# Patient Record
Sex: Female | Born: 1980 | Race: White | Hispanic: No | Marital: Married | Smoking: Current every day smoker
Health system: Southern US, Community
[De-identification: ages and names within clinical notes are randomized; demographics above are authoritative.]

## PROBLEM LIST (undated history)

## (undated) DIAGNOSIS — J449 Chronic obstructive pulmonary disease, unspecified: Secondary | ICD-10-CM

## (undated) DIAGNOSIS — M1990 Unspecified osteoarthritis, unspecified site: Secondary | ICD-10-CM

## (undated) DIAGNOSIS — Z302 Encounter for sterilization: Secondary | ICD-10-CM

## (undated) DIAGNOSIS — Z5189 Encounter for other specified aftercare: Secondary | ICD-10-CM

## (undated) DIAGNOSIS — F419 Anxiety disorder, unspecified: Secondary | ICD-10-CM

## (undated) DIAGNOSIS — T7840XA Allergy, unspecified, initial encounter: Secondary | ICD-10-CM

## (undated) DIAGNOSIS — H60502 Unspecified acute noninfective otitis externa, left ear: Secondary | ICD-10-CM

## (undated) DIAGNOSIS — G43909 Migraine, unspecified, not intractable, without status migrainosus: Secondary | ICD-10-CM

## (undated) DIAGNOSIS — G709 Myoneural disorder, unspecified: Secondary | ICD-10-CM

## (undated) DIAGNOSIS — L299 Pruritus, unspecified: Secondary | ICD-10-CM

## (undated) DIAGNOSIS — H66001 Acute suppurative otitis media without spontaneous rupture of ear drum, right ear: Secondary | ICD-10-CM

## (undated) DIAGNOSIS — O09521 Supervision of elderly multigravida, first trimester: Secondary | ICD-10-CM

## (undated) DIAGNOSIS — IMO0002 Reserved for concepts with insufficient information to code with codable children: Secondary | ICD-10-CM

## (undated) DIAGNOSIS — J302 Other seasonal allergic rhinitis: Secondary | ICD-10-CM

## (undated) DIAGNOSIS — R569 Unspecified convulsions: Secondary | ICD-10-CM

## (undated) DIAGNOSIS — B86 Scabies: Secondary | ICD-10-CM

## (undated) DIAGNOSIS — G4730 Sleep apnea, unspecified: Secondary | ICD-10-CM

## (undated) DIAGNOSIS — K8020 Calculus of gallbladder without cholecystitis without obstruction: Secondary | ICD-10-CM

## (undated) DIAGNOSIS — L03818 Cellulitis of other sites: Secondary | ICD-10-CM

## (undated) DIAGNOSIS — K5090 Crohn's disease, unspecified, without complications: Secondary | ICD-10-CM

## (undated) HISTORY — DX: Supervision of elderly multigravida, first trimester: O09.521

## (undated) HISTORY — DX: Unspecified convulsions: R56.9

## (undated) HISTORY — DX: Encounter for sterilization: Z30.2

## (undated) HISTORY — DX: Chronic obstructive pulmonary disease, unspecified: J44.9

## (undated) HISTORY — DX: Pruritus, unspecified: L29.9

## (undated) HISTORY — DX: Encounter for other specified aftercare: Z51.89

## (undated) HISTORY — DX: Reserved for concepts with insufficient information to code with codable children: IMO0002

## (undated) HISTORY — DX: Scabies: B86

## (undated) HISTORY — DX: Anxiety disorder, unspecified: F41.9

## (undated) HISTORY — PX: GALLBLADDER SURGERY: SHX652

## (undated) HISTORY — DX: Myoneural disorder, unspecified: G70.9

## (undated) HISTORY — DX: Sleep apnea, unspecified: G47.30

## (undated) HISTORY — DX: Allergy, unspecified, initial encounter: T78.40XA

## (undated) HISTORY — PX: CHOLESTEATOMA EXCISION: SHX1345

## (undated) HISTORY — DX: Unspecified osteoarthritis, unspecified site: M19.90

## (undated) HISTORY — DX: Acute suppurative otitis media without spontaneous rupture of ear drum, right ear: H66.001

## (undated) HISTORY — DX: Unspecified acute noninfective otitis externa, left ear: H60.502

## (undated) HISTORY — DX: Cellulitis of other sites: L03.818

## (undated) HISTORY — PX: CHOLECYSTECTOMY: SHX55

---

## 2010-11-26 ENCOUNTER — Emergency Department (HOSPITAL_COMMUNITY)
Admission: EM | Admit: 2010-11-26 | Discharge: 2010-11-26 | Disposition: A | Discharge status: Home / Self Care | Payer: Medicaid Other | Attending: Emergency Medicine | Admitting: Emergency Medicine

## 2010-11-26 ENCOUNTER — Emergency Department (HOSPITAL_COMMUNITY): Payer: Medicaid Other

## 2010-11-26 ENCOUNTER — Encounter: Payer: Self-pay | Admitting: Emergency Medicine

## 2010-11-26 DIAGNOSIS — F17200 Nicotine dependence, unspecified, uncomplicated: Secondary | ICD-10-CM | POA: Insufficient documentation

## 2010-11-26 DIAGNOSIS — R109 Unspecified abdominal pain: Secondary | ICD-10-CM | POA: Insufficient documentation

## 2010-11-26 DIAGNOSIS — K5090 Crohn's disease, unspecified, without complications: Secondary | ICD-10-CM | POA: Insufficient documentation

## 2010-11-26 DIAGNOSIS — J45909 Unspecified asthma, uncomplicated: Secondary | ICD-10-CM | POA: Insufficient documentation

## 2010-11-26 DIAGNOSIS — R51 Headache: Secondary | ICD-10-CM | POA: Insufficient documentation

## 2010-11-26 DIAGNOSIS — R10819 Abdominal tenderness, unspecified site: Secondary | ICD-10-CM | POA: Insufficient documentation

## 2010-11-26 HISTORY — DX: Calculus of gallbladder without cholecystitis without obstruction: K80.20

## 2010-11-26 HISTORY — DX: Other seasonal allergic rhinitis: J30.2

## 2010-11-26 HISTORY — DX: Crohn's disease, unspecified, without complications: K50.90

## 2010-11-26 HISTORY — DX: Migraine, unspecified, not intractable, without status migrainosus: G43.909

## 2010-11-26 LAB — URINALYSIS, ROUTINE W REFLEX MICROSCOPIC
Bilirubin Urine: NEGATIVE
Glucose, UA: NEGATIVE mg/dL
Hgb urine dipstick: NEGATIVE
Ketones, ur: NEGATIVE mg/dL
Protein, ur: NEGATIVE mg/dL
Urobilinogen, UA: 0.2 mg/dL (ref 0.0–1.0)

## 2010-11-26 LAB — PREGNANCY, URINE: Preg Test, Ur: NEGATIVE

## 2010-11-26 MED ORDER — OXYCODONE-ACETAMINOPHEN 5-325 MG PO TABS
1.0000 | ORAL_TABLET | Freq: Once | ORAL | Status: AC
  Administered 2010-11-26: 1 via ORAL
  Filled 2010-11-26: qty 1

## 2010-11-26 MED ORDER — OXYCODONE-ACETAMINOPHEN 5-325 MG PO TABS: 1.0000 | ORAL_TABLET | ORAL | Status: AC | PRN

## 2010-11-26 MED ORDER — KETOROLAC TROMETHAMINE 60 MG/2ML IM SOLN
60.0000 mg | Freq: Once | INTRAMUSCULAR | Status: AC
  Administered 2010-11-26: 60 mg via INTRAMUSCULAR
  Filled 2010-11-26: qty 2

## 2010-11-26 NOTE — ED Notes (Signed)
MD at bedside to evaluate.

## 2010-11-26 NOTE — ED Notes (Signed)
Per patient dizziness, migraines, blood pressure "dropping", and left side flank pain x3 months.

## 2010-11-26 NOTE — ED Notes (Signed)
Patient also requesting to have left ear checked dur to drainage.

## 2010-11-26 NOTE — ED Provider Notes (Addendum)
History     CSN: [id number] Arrival date & time: 11/26/2010  6:19 PM   First MD Initiated Contact with Patient 11/26/10 1825      Chief Complaint  Patient presents with  . Dizziness  . Flank Pain  . Headache    (Consider location/radiation/quality/duration/timing/severity/associated sxs/prior treatment) Patient is a 30 y.o. female presenting with flank pain and headaches. The history is provided by the patient.  Flank Pain This is a recurrent problem. The current episode started more than 1 week ago (Patient states she has Crohn's but this pain has been coming and going for two months.). Episode frequency: Pain comes and goes more than ten times/day. The problem has been gradually worsening. Associated symptoms include headaches. Pertinent negatives include no chest pain, no abdominal pain and no shortness of breath. The symptoms are aggravated by nothing. The symptoms are relieved by medications (tylenol and advil without relief.). She has tried a warm compress, a cold compress, rest and acetaminophen for the symptoms. The treatment provided no relief.  Headache  Pertinent negatives include no shortness of breath.  They are chronic and this is not different from usual.  Past Medical History  Diagnosis Date  . Migraines   . Crohn's disease   . Seasonal allergies   . Asthma   . Gallstones     Past Surgical History  Procedure Date  . Cholesteatoma excision   . Cholecystectomy     Family History  Problem Relation Age of Onset  . Cancer Other   . Heart failure Other   . Diabetes Other     History  Substance Use Topics  . Smoking status: Current Everyday Smoker -- 1.0 packs/day for 20 years    Types: Cigarettes  . Smokeless tobacco: Never Used  . Alcohol Use: Yes     occasionally    OB History    Grav Para Term Preterm Abortions TAB SAB Ect Mult Living   1 1 1       1       Review of Systems  Respiratory: Negative for shortness of breath.   Cardiovascular:  Negative for chest pain.  Gastrointestinal: Negative for abdominal pain.  Genitourinary: Positive for flank pain. Negative for dysuria, frequency, hematuria, decreased urine volume, vaginal bleeding, vaginal discharge, enuresis, difficulty urinating, vaginal pain, menstrual problem, pelvic pain and dyspareunia.  Neurological: Positive for headaches.  All other systems reviewed and are negative.    Allergies  Darvocet and Penicillins  Home Medications  No current outpatient prescriptions on file.  BP 121/78  Pulse 89  Temp(Src) 98.2 F (36.8 C) (Oral)  Resp 16  Ht 5' 4.5" (1.638 m)  Wt 175 lb (79.379 kg)  BMI 29.57 kg/m2  SpO2 99%  LMP 11/04/2010  Physical Exam  Nursing note and vitals reviewed. Constitutional: She is oriented to person, place, and time. She appears well-developed and well-nourished.  HENT:  Head: Normocephalic and atraumatic.  Eyes: Conjunctivae are normal. Pupils are equal, round, and reactive to light.  Neck: Normal range of motion. Neck supple.  Cardiovascular: Normal rate and regular rhythm.   Pulmonary/Chest: Effort normal and breath sounds normal.  Abdominal: Soft. Bowel sounds are normal.       Diffuse mild upper abdominal tenderness  Musculoskeletal: Normal range of motion.  Neurological: She is alert and oriented to person, place, and time. She has normal reflexes.  Skin: Skin is warm.  Psychiatric: She has a normal mood and affect.    ED Course  Procedures (including critical  care time)  Labs Reviewed - No data to display No results found.   No diagnosis found.  Results for orders placed during the hospital encounter of 11/26/10  URINALYSIS, ROUTINE W REFLEX MICROSCOPIC      Component Value Range   Color, Urine YELLOW  YELLOW    Appearance CLEAR  CLEAR    Specific Gravity, Urine 1.020  1.005 - 1.030    pH 6.5  5.0 - 8.0    Glucose, UA NEGATIVE  NEGATIVE (mg/dL)   Hgb urine dipstick NEGATIVE  NEGATIVE    Bilirubin Urine NEGATIVE   NEGATIVE    Ketones, ur NEGATIVE  NEGATIVE (mg/dL)   Protein, ur NEGATIVE  NEGATIVE (mg/dL)   Urobilinogen, UA 0.2  0.0 - 1.0 (mg/dL)   Nitrite NEGATIVE  NEGATIVE    Leukocytes, UA NEGATIVE  NEGATIVE   PREGNANCY, URINE      Component Value Range   Preg Test, Ur NEGATIVE    POCT PREGNANCY, URINE      Component Value Range   Preg Test, Ur NEGATIVE      Ct Abdomen Pelvis Wo Contrast  11/26/2010  *RADIOLOGY REPORT*  Clinical Data: Left flank pain for 3 months.  Prior cholecystectomy.  CT ABDOMEN AND PELVIS WITHOUT CONTRAST  Technique:  Multidetector CT imaging of the abdomen and pelvis was performed following the standard protocol without intravenous contrast.  Comparison: None.  Findings: Tiny nonobstructing renal calculi.  Mild fatty infiltration of liver.  Unenhanced imaging without focal liver, splenic, adrenal, renal or pancreatic lesion.  Post cholecystectomy.  Under distended stomach and portions of colon and small bowel without evidence of extraluminal bowel inflammatory process, free air or free fluid.  Slight prominence of fat within wall of the cecum/ascending colon.  This may be a normal finding although also described in patients with chronic colitis.  Unenhanced imaging of the urinary bladder unremarkable.  Probable small left ovarian cyst incidentally noted.  No pelvic or abdominal adenopathy.  Top normal sized right inguinal node.  No bony destructive lesion.  Small bone island.  Fatty containing periumbilical hernia.  IMPRESSION: Tiny nonobstructing renal calculi.  No acute bowel inflammatory process.  Please see above discussion.  Original Report Authenticated By: Doug Sou, M.D.    MDM  Patient had some pain relief with IV Toradol but states she continues to have pain. CT of the abdomen reveals small Bilateral nonobstructing kidney since. She has no hematuria. She's not appear to have any ureteral obstruction. He is given 2 Percocet here. And she is advised to followup with  her primary care at home in [place] appear      Shaune Pollack, MD 11/26/10 2107  Shaune Pollack, MD 11/26/10 2107

## 2011-02-14 DIAGNOSIS — K219 Gastro-esophageal reflux disease without esophagitis: Secondary | ICD-10-CM | POA: Insufficient documentation

## 2011-02-14 DIAGNOSIS — Z7960 Long term (current) use of unspecified immunomodulators and immunosuppressants: Secondary | ICD-10-CM | POA: Insufficient documentation

## 2011-02-14 DIAGNOSIS — Z79899 Other long term (current) drug therapy: Secondary | ICD-10-CM | POA: Insufficient documentation

## 2011-02-22 ENCOUNTER — Encounter (HOSPITAL_COMMUNITY): Payer: Self-pay | Admitting: Emergency Medicine

## 2011-02-22 ENCOUNTER — Emergency Department (HOSPITAL_COMMUNITY): Payer: Medicaid Other

## 2011-02-22 ENCOUNTER — Emergency Department (HOSPITAL_COMMUNITY)
Admission: EM | Admit: 2011-02-22 | Discharge: 2011-02-22 | Disposition: A | Discharge status: Home / Self Care | Payer: Medicaid Other | Attending: Emergency Medicine | Admitting: Emergency Medicine

## 2011-02-22 DIAGNOSIS — Z79899 Other long term (current) drug therapy: Secondary | ICD-10-CM | POA: Insufficient documentation

## 2011-02-22 DIAGNOSIS — F17200 Nicotine dependence, unspecified, uncomplicated: Secondary | ICD-10-CM | POA: Insufficient documentation

## 2011-02-22 DIAGNOSIS — N390 Urinary tract infection, site not specified: Secondary | ICD-10-CM | POA: Insufficient documentation

## 2011-02-22 DIAGNOSIS — J45909 Unspecified asthma, uncomplicated: Secondary | ICD-10-CM | POA: Insufficient documentation

## 2011-02-22 DIAGNOSIS — R109 Unspecified abdominal pain: Secondary | ICD-10-CM | POA: Insufficient documentation

## 2011-02-22 DIAGNOSIS — K5090 Crohn's disease, unspecified, without complications: Secondary | ICD-10-CM | POA: Insufficient documentation

## 2011-02-22 DIAGNOSIS — R112 Nausea with vomiting, unspecified: Secondary | ICD-10-CM | POA: Insufficient documentation

## 2011-02-22 LAB — POCT I-STAT, CHEM 8
Calcium, Ion: 1.18 mmol/L (ref 1.12–1.32)
Glucose, Bld: 85 mg/dL (ref 70–99)
HCT: 48 % — ABNORMAL HIGH (ref 36.0–46.0)
Hemoglobin: 16.3 g/dL — ABNORMAL HIGH (ref 12.0–15.0)

## 2011-02-22 LAB — DIFFERENTIAL
Basophils Absolute: 0 10*3/uL (ref 0.0–0.1)
Basophils Relative: 0 % (ref 0–1)
Eosinophils Relative: 0 % (ref 0–5)
Monocytes Absolute: 0.9 10*3/uL (ref 0.1–1.0)
Neutro Abs: 8.2 10*3/uL — ABNORMAL HIGH (ref 1.7–7.7)

## 2011-02-22 LAB — CBC
HCT: 46.2 % — ABNORMAL HIGH (ref 36.0–46.0)
MCHC: 34.6 g/dL (ref 30.0–36.0)
MCV: 90.6 fL (ref 78.0–100.0)
Platelets: 224 10*3/uL (ref 150–400)
RDW: 12.9 % (ref 11.5–15.5)

## 2011-02-22 LAB — URINALYSIS, MICROSCOPIC ONLY
Specific Gravity, Urine: 1.028 (ref 1.005–1.030)
Urobilinogen, UA: 1 mg/dL (ref 0.0–1.0)
pH: 6 (ref 5.0–8.0)

## 2011-02-22 LAB — POCT PREGNANCY, URINE: Preg Test, Ur: NEGATIVE

## 2011-02-22 MED ORDER — OXYCODONE-ACETAMINOPHEN 5-325 MG PO TABS
1.0000 | ORAL_TABLET | Freq: Once | ORAL | Status: AC
  Administered 2011-02-22: 1 via ORAL

## 2011-02-22 MED ORDER — OXYCODONE-ACETAMINOPHEN 5-325 MG PO TABS
ORAL_TABLET | ORAL | Status: AC
  Filled 2011-02-22: qty 1

## 2011-02-22 MED ORDER — SULFAMETHOXAZOLE-TMP DS 800-160 MG PO TABS
1.0000 | ORAL_TABLET | Freq: Once | ORAL | Status: AC
  Administered 2011-02-22: 1 via ORAL
  Filled 2011-02-22: qty 1

## 2011-02-22 MED ORDER — SODIUM CHLORIDE 0.9 % IV SOLN
Freq: Once | INTRAVENOUS | Status: AC
  Administered 2011-02-22: 20 mL via INTRAVENOUS

## 2011-02-22 MED ORDER — HYDROMORPHONE HCL PF 1 MG/ML IJ SOLN
0.5000 mg | Freq: Once | INTRAMUSCULAR | Status: AC
  Administered 2011-02-22: 0.5 mg via INTRAVENOUS
  Filled 2011-02-22: qty 1

## 2011-02-22 MED ORDER — SULFAMETHOXAZOLE-TMP DS 800-160 MG PO TABS: 1.0000 | ORAL_TABLET | Freq: Two times a day (BID) | ORAL | Status: AC

## 2011-02-22 MED ORDER — ONDANSETRON HCL 4 MG/2ML IJ SOLN
4.0000 mg | Freq: Once | INTRAMUSCULAR | Status: AC
  Administered 2011-02-22: 4 mg via INTRAVENOUS
  Filled 2011-02-22: qty 2

## 2011-02-22 NOTE — ED Provider Notes (Signed)
History     CSN: [id number]  Arrival date & time 02/22/11  0144   First MD Initiated Contact with Patient 02/22/11 0221      Chief Complaint  Patient presents with  . Flank Pain    (Consider location/radiation/quality/duration/timing/severity/associated sxs/prior treatment) Patient is a 31 y.o. female presenting with flank pain. The history is provided by the patient.  Flank Pain This is a new problem. The current episode started in the past 7 days. The problem occurs constantly. The problem has been gradually worsening. Associated symptoms include nausea. Pertinent negatives include no abdominal pain or vomiting.    Past Medical History  Diagnosis Date  . Migraines   . Crohn's disease   . Seasonal allergies   . Asthma   . Gallstones     Past Surgical History  Procedure Date  . Cholesteatoma excision   . Cholecystectomy     Family History  Problem Relation Age of Onset  . Cancer Other   . Heart failure Other   . Diabetes Other     History  Substance Use Topics  . Smoking status: Current Everyday Smoker -- 1.0 packs/day for 20 years    Types: Cigarettes  . Smokeless tobacco: Never Used  . Alcohol Use: Yes     occasionally    OB History    Grav Para Term Preterm Abortions TAB SAB Ect Mult Living   1 1 1       1       Review of Systems  Gastrointestinal: Positive for nausea. Negative for vomiting, abdominal pain and diarrhea.  Genitourinary: Positive for flank pain. Negative for dysuria, frequency, vaginal bleeding and vaginal pain.    Allergies  Bee venom; Darvocet; and Penicillins  Home Medications   Current Outpatient Rx  Name Route Sig Dispense Refill  . ACETAMINOPHEN 500 MG PO TABS Oral Take 500 mg by mouth as needed. For pain     . ADALIMUMAB 40 MG/0.8ML Hardesty KIT Subcutaneous Inject 40 mg into the skin every 14 (fourteen) days.      . ALBUTEROL SULFATE HFA 108 (90 BASE) MCG/ACT IN AERS Inhalation Inhale 2 puffs into the lungs every 6 (six) hours  as needed. For shortness of breath     . DIAZEPAM 10 MG PO TABS Oral Take 10 mg by mouth daily.      . IBUPROFEN 200 MG PO TABS Oral Take 800 mg by mouth every 6 (six) hours as needed. For pain     . PANTOPRAZOLE SODIUM 20 MG PO TBEC Oral Take 20 mg by mouth 2 (two) times daily.    . SUMATRIPTAN SUCCINATE 100 MG PO TABS Oral Take 100 mg by mouth every 2 (two) hours as needed. For migraines     . SULFAMETHOXAZOLE-TMP DS 800-160 MG PO TABS Oral Take 1 tablet by mouth 2 (two) times daily. 6 tablet 0    BP 129/96  Pulse 112  Temp(Src) 98.1 F (36.7 C) (Oral)  Resp 18  SpO2 97%  LMP 01/31/2011  Physical Exam  Constitutional: She is oriented to person, place, and time. She appears well-developed and well-nourished.  HENT:  Head: Normocephalic.  Eyes: Pupils are equal, round, and reactive to light.  Neck: Normal range of motion.  Cardiovascular: Normal rate.   Pulmonary/Chest: Effort normal.  Abdominal: Soft. She exhibits no distension. There is no tenderness.  Neurological: She is alert and oriented to person, place, and time.  Skin: Skin is warm and dry.  Psychiatric: She has a normal  mood and affect.    ED Course  Procedures (including critical care time)  Labs Reviewed  URINALYSIS, WITH MICROSCOPIC - Abnormal; Notable for the following:    Color, Urine AMBER (*) BIOCHEMICALS MAY BE AFFECTED BY COLOR   APPearance CLOUDY (*)    Hgb urine dipstick LARGE (*)    Bilirubin Urine SMALL (*)    Ketones, ur 15 (*)    Protein, ur 30 (*)    Leukocytes, UA MODERATE (*)    Bacteria, UA FEW (*)    Squamous Epithelial / LPF MANY (*)    All other components within normal limits  CBC - Abnormal; Notable for the following:    WBC 13.6 (*)    Hemoglobin 16.0 (*)    HCT 46.2 (*)    All other components within normal limits  DIFFERENTIAL - Abnormal; Notable for the following:    Neutro Abs 8.2 (*)    Lymphs Abs 4.4 (*)    All other components within normal limits  POCT I-STAT, CHEM 8 -  Abnormal; Notable for the following:    Hemoglobin 16.3 (*)    HCT 48.0 (*)    All other components within normal limits  POCT PREGNANCY, URINE   Ct Abdomen Pelvis Wo Contrast  02/22/2011  *RADIOLOGY REPORT*  Clinical Data: Left-sided pain, nausea, vomiting.  CT ABDOMEN AND PELVIS WITHOUT CONTRAST  Technique:  Multidetector CT imaging of the abdomen and pelvis was performed following the standard protocol without intravenous contrast.  Comparison: 11/26/2010 CT  Findings: Limited images through the lung bases demonstrate no significant appreciable abnormality. The heart size is within normal limits. No pleural or pericardial effusion.  Intra-abdominal organ evaluation is limited without intravenous contrast.  Heterogeneous liver attenuation may reflect areas of fatty infiltration.  Status post cholecystectomy.  No biliary ductal dilatation.  Unremarkable spleen, pancreas, adrenal glands.  Symmetric renal size.  No hydronephrosis or hydroureter. Tiny nonobstructing right renal stone.  Otherwise, no urinary tract calculi identified.  No bowel obstruction.  No CT evidence for colitis.  Normal appendix.  No free intraperitoneal air or fluid.  Thin-walled bladder.  Unremarkable uterus and adnexa.  Trace free fluid within the pelvis.  No lymphadenopathy.  Normal caliber vasculature.  No acute osseous abnormality.  IMPRESSION: No hydronephrosis or urinary tract calculi.  The previously noted left renal stone is not identified today and may have passed in the interval.  Tiny nonobstructing right renal stone.  Original Report Authenticated By: Waneta Martins, M.D.     1. Urinary tract infection       MDM  Patient with left flank pain radiating to her left lower quadrant.  Nausea, has a history of kidney stones, has been able to pass them without intervention.  She's been taking over-the-counter ibuprofen or Tylenol without any relief.  Request IV start fluids at 20 mils per hour Zofran for nausea, and  Dilaudid for pain and will obtain noncontrast CT to locate the stone After review of her CT scan.  There is no evidence of a renal calculi on the left although there is some hematuria.  There is number of white cells in her urine, most likely a urinary tract infection.  Will treat as such with Bactrim and have her followup with her primary care physician        Arman Filter, NP 02/22/11 0525  Arman Filter, NP 02/22/11 5621  Arman Filter, NP 02/22/11 [phone]

## 2011-02-22 NOTE — ED Provider Notes (Signed)
Medical screening examination/treatment/procedure(s) were performed by non-physician practitioner and as supervising physician I was immediately available for consultation/collaboration.  Threasa Beards, MD 02/22/11 [phone]

## 2011-02-22 NOTE — ED Notes (Signed)
PT. REPORTS LEFT FLANK PAIN WITH HEMATURIA AND VOMITTING ONSET 4 DAYS AGO , STATES HISTORY OF KIDNEY STONES .

## 2011-02-22 NOTE — ED Notes (Signed)
Patient reports blood in urine and left flank pain x 4 with nausea days states she has a history of kidney stones and feels the same

## 2011-09-08 DIAGNOSIS — O0990 Supervision of high risk pregnancy, unspecified, unspecified trimester: Secondary | ICD-10-CM | POA: Insufficient documentation

## 2012-07-19 DIAGNOSIS — IMO0002 Reserved for concepts with insufficient information to code with codable children: Secondary | ICD-10-CM

## 2012-07-19 HISTORY — DX: Reserved for concepts with insufficient information to code with codable children: IMO0002

## 2012-10-01 DIAGNOSIS — M47816 Spondylosis without myelopathy or radiculopathy, lumbar region: Secondary | ICD-10-CM | POA: Insufficient documentation

## 2013-04-12 DIAGNOSIS — R209 Unspecified disturbances of skin sensation: Secondary | ICD-10-CM | POA: Insufficient documentation

## 2013-04-12 DIAGNOSIS — Z79899 Other long term (current) drug therapy: Secondary | ICD-10-CM | POA: Insufficient documentation

## 2013-11-21 ENCOUNTER — Encounter (HOSPITAL_COMMUNITY): Payer: Self-pay | Admitting: Emergency Medicine

## 2015-06-04 DIAGNOSIS — R29898 Other symptoms and signs involving the musculoskeletal system: Secondary | ICD-10-CM | POA: Insufficient documentation

## 2015-06-08 DIAGNOSIS — K50113 Crohn's disease of large intestine with fistula: Secondary | ICD-10-CM | POA: Insufficient documentation

## 2015-06-26 DIAGNOSIS — M79605 Pain in left leg: Secondary | ICD-10-CM

## 2015-06-26 DIAGNOSIS — M79604 Pain in right leg: Secondary | ICD-10-CM | POA: Insufficient documentation

## 2015-06-26 DIAGNOSIS — G8929 Other chronic pain: Secondary | ICD-10-CM | POA: Insufficient documentation

## 2015-11-27 LAB — HM PAP SMEAR: HM Pap smear: NEGATIVE

## 2015-11-29 DIAGNOSIS — O09899 Supervision of other high risk pregnancies, unspecified trimester: Secondary | ICD-10-CM | POA: Insufficient documentation

## 2015-11-29 DIAGNOSIS — Z2839 Other underimmunization status: Secondary | ICD-10-CM | POA: Insufficient documentation

## 2016-01-23 DIAGNOSIS — K5090 Crohn's disease, unspecified, without complications: Secondary | ICD-10-CM | POA: Insufficient documentation

## 2016-02-19 DIAGNOSIS — Z302 Encounter for sterilization: Secondary | ICD-10-CM

## 2016-02-19 HISTORY — DX: Encounter for sterilization: Z30.2

## 2016-09-23 DIAGNOSIS — O09521 Supervision of elderly multigravida, first trimester: Secondary | ICD-10-CM

## 2016-09-23 HISTORY — DX: Supervision of elderly multigravida, first trimester: O09.521

## 2017-01-28 DIAGNOSIS — O0933 Supervision of pregnancy with insufficient antenatal care, third trimester: Secondary | ICD-10-CM | POA: Insufficient documentation

## 2017-01-28 DIAGNOSIS — O9A419 Sexual abuse complicating pregnancy, unspecified trimester: Secondary | ICD-10-CM | POA: Insufficient documentation

## 2017-06-21 DIAGNOSIS — F909 Attention-deficit hyperactivity disorder, unspecified type: Secondary | ICD-10-CM | POA: Insufficient documentation

## 2017-06-21 DIAGNOSIS — F319 Bipolar disorder, unspecified: Secondary | ICD-10-CM | POA: Insufficient documentation

## 2017-06-21 DIAGNOSIS — R630 Anorexia: Secondary | ICD-10-CM | POA: Insufficient documentation

## 2017-06-21 DIAGNOSIS — H900 Conductive hearing loss, bilateral: Secondary | ICD-10-CM | POA: Insufficient documentation

## 2017-06-21 DIAGNOSIS — J45909 Unspecified asthma, uncomplicated: Secondary | ICD-10-CM | POA: Insufficient documentation

## 2017-06-21 DIAGNOSIS — T7411XA Adult physical abuse, confirmed, initial encounter: Secondary | ICD-10-CM | POA: Insufficient documentation

## 2017-07-21 ENCOUNTER — Emergency Department
Admission: EM | Admit: 2017-07-21 | Discharge: 2017-07-21 | Disposition: A | Discharge status: Home / Self Care | Payer: Medicaid Other | Attending: Emergency Medicine | Admitting: Emergency Medicine

## 2017-07-21 ENCOUNTER — Encounter: Payer: Self-pay | Admitting: Emergency Medicine

## 2017-07-21 DIAGNOSIS — Z79899 Other long term (current) drug therapy: Secondary | ICD-10-CM | POA: Diagnosis not present

## 2017-07-21 DIAGNOSIS — R1032 Left lower quadrant pain: Secondary | ICD-10-CM | POA: Diagnosis present

## 2017-07-21 DIAGNOSIS — K5090 Crohn's disease, unspecified, without complications: Secondary | ICD-10-CM | POA: Diagnosis not present

## 2017-07-21 DIAGNOSIS — F17210 Nicotine dependence, cigarettes, uncomplicated: Secondary | ICD-10-CM | POA: Diagnosis not present

## 2017-07-21 DIAGNOSIS — R109 Unspecified abdominal pain: Secondary | ICD-10-CM

## 2017-07-21 DIAGNOSIS — J45909 Unspecified asthma, uncomplicated: Secondary | ICD-10-CM | POA: Diagnosis not present

## 2017-07-21 LAB — CBC
HCT: 37.4 % (ref 35.0–47.0)
Hemoglobin: 12.1 g/dL (ref 12.0–16.0)
MCH: 25.7 pg — ABNORMAL LOW (ref 26.0–34.0)
MCHC: 32.3 g/dL (ref 32.0–36.0)
MCV: 79.7 fL — ABNORMAL LOW (ref 80.0–100.0)
Platelets: 303 10*3/uL (ref 150–440)
RBC: 4.69 MIL/uL (ref 3.80–5.20)
RDW: 17.7 % — AB (ref 11.5–14.5)
WBC: 12.1 10*3/uL — AB (ref 3.6–11.0)

## 2017-07-21 LAB — URINALYSIS, ROUTINE W REFLEX MICROSCOPIC
BACTERIA UA: NONE SEEN
BILIRUBIN URINE: NEGATIVE
Glucose, UA: NEGATIVE mg/dL
HGB URINE DIPSTICK: NEGATIVE
Ketones, ur: NEGATIVE mg/dL
NITRITE: NEGATIVE
Protein, ur: NEGATIVE mg/dL
SPECIFIC GRAVITY, URINE: 1.021 (ref 1.005–1.030)
pH: 6 (ref 5.0–8.0)

## 2017-07-21 LAB — COMPREHENSIVE METABOLIC PANEL
ALBUMIN: 3.2 g/dL — AB (ref 3.5–5.0)
ALT: 9 U/L (ref 0–44)
ANION GAP: 7 (ref 5–15)
AST: 17 U/L (ref 15–41)
Alkaline Phosphatase: 77 U/L (ref 38–126)
BUN: 9 mg/dL (ref 6–20)
CHLORIDE: 109 mmol/L (ref 98–111)
CO2: 24 mmol/L (ref 22–32)
Calcium: 8.5 mg/dL — ABNORMAL LOW (ref 8.9–10.3)
Creatinine, Ser: 0.51 mg/dL (ref 0.44–1.00)
GFR calc Af Amer: >60 mL/min (ref >60)
GFR calc non Af Amer: >60 mL/min (ref >60)
GLUCOSE: 96 mg/dL (ref 70–99)
Potassium: 3.8 mmol/L (ref 3.5–5.1)
SODIUM: 140 mmol/L (ref 135–145)
TOTAL PROTEIN: 7.5 g/dL (ref 6.5–8.1)
Total Bilirubin: 0.5 mg/dL (ref 0.3–1.2)

## 2017-07-21 LAB — LIPASE, BLOOD: Lipase: 40 U/L (ref 11–51)

## 2017-07-21 MED ORDER — HYDROCODONE-ACETAMINOPHEN 5-325 MG PO TABS: 1.0000 | ORAL_TABLET | ORAL | 0 refills | Status: DC | PRN

## 2017-07-21 MED ORDER — HYDROCODONE-ACETAMINOPHEN 5-325 MG PO TABS
1.0000 | ORAL_TABLET | Freq: Once | ORAL | Status: AC
  Administered 2017-07-21: 1 via ORAL
  Filled 2017-07-21: qty 1

## 2017-07-21 NOTE — ED Triage Notes (Signed)
PT arrived with complaints of left flank pain. Pt denies any pain/burning with urination.

## 2017-07-21 NOTE — ED Provider Notes (Signed)
[facility] Emergency Department Provider Note  Time seen: 11:19 AM  I have reviewed the triage vital signs and the nursing notes.   HISTORY  Chief Complaint Flank Pain    HPI Sherry Gomez is a 37 y.o. female with a past medical history of Crohn's disease presents to the emergency department for left flank pain.  According to the patient for the past 1 week she has been experiencing intermittent left flank pain mostly in the left back.  Denies any hematuria, dysuria, fever, nausea, vomiting.  Has a history of Crohn's states she has frequent diarrhea but denies any black or bloody stool.  Denies any prior kidney stones.  Denies any musculoskeletal symptoms.   Past Medical History:  Diagnosis Date  . Asthma   . Crohn's disease (HCC)   . Gallstones   . Migraines   . Seasonal allergies     There are no active problems to display for this patient.   Past Surgical History:  Procedure Laterality Date  . CHOLECYSTECTOMY    . CHOLESTEATOMA EXCISION      Prior to Admission medications   Medication Sig Start Date End Date Taking? Authorizing Provider  acetaminophen (TYLENOL) 500 MG tablet Take 500 mg by mouth as needed. For pain     [provider]  adalimumab (HUMIRA) 40 MG/0.8ML injection Inject 40 mg into the skin every 14 (fourteen) days.      [provider]  albuterol (PROVENTIL HFA;VENTOLIN HFA) 108 (90 BASE) MCG/ACT inhaler Inhale 2 puffs into the lungs every 6 (six) hours as needed. For shortness of breath     [provider]  diazepam (VALIUM) 10 MG tablet Take 10 mg by mouth daily.      [provider]  ibuprofen (ADVIL,MOTRIN) 200 MG tablet Take 800 mg by mouth every 6 (six) hours as needed. For pain     [provider]  pantoprazole (PROTONIX) 20 MG tablet Take 20 mg by mouth 2 (two) times daily.    [provider]  SUMAtriptan (IMITREX) 100 MG tablet Take 100 mg by mouth every 2 (two) hours as  needed. For migraines     [provider]    Allergies  Allergen Reactions  . Bee Venom Swelling  . Darvocet [Propoxyphene N-Acetaminophen] Hives  . Penicillins Other (See Comments)    Family History  Problem Relation Age of Onset  . Cancer Other   . Heart failure Other   . Diabetes Other     Social History Social History   Tobacco Use  . Smoking status: Current Every Day Smoker    Packs/day: 1.00    Years: 20.00    Pack years: 20.00    Types: Cigarettes  . Smokeless tobacco: Never Used  Substance Use Topics  . Alcohol use: Yes    Comment: occasionally  . Drug use: No    Review of Systems Constitutional: Negative for fever. Eyes: Negative for visual complaints ENT: Negative for recent illness/congestion Cardiovascular: Negative for chest pain. Respiratory: Negative for shortness of breath. Gastrointestinal: Left flank pain but no anterior abdominal pain.  Negative for nausea vomiting or diarrhea. Genitourinary: Negative for urinary compaints.  Negative for bleeding or discharge. Musculoskeletal: Negative for musculoskeletal complaints Skin: Negative for skin complaints  Neurological: Negative for headache All other ROS negative  ____________________________________________   PHYSICAL EXAM:  VITAL SIGNS: ED Triage Vitals  Enc Vitals Group     BP 07/21/17 1054 (!) 144/93     Pulse Rate  07/21/17 1054 96     Resp 07/21/17 1054 20     Temp 07/21/17 1054 98.2 F (36.8 C)     Temp Source 07/21/17 1054 Oral     SpO2 07/21/17 1054 98 %     Weight 07/21/17 1055 126 lb (57.2 kg)     Height 07/21/17 1055 5' 4.5" (1.638 m)     Head Circumference --      Peak Flow --      Pain Score 07/21/17 1056 6     Pain Loc --      Pain Edu? --      Excl. in GC? --    Constitutional: Alert and oriented. Well appearing and in no distress. Eyes: Normal exam ENT   Head: Normocephalic and atraumatic.   Mouth/Throat: Mucous membranes are  moist. Cardiovascular: Normal rate, regular rhythm. No murmur Respiratory: Normal respiratory effort without tachypnea nor retractions. Breath sounds are clear  Gastrointestinal: Soft and nontender. No distention.  Mild left CVA tenderness to palpation. Musculoskeletal: Nontender with normal range of motion in all extremities.  Neurologic:  Normal speech and language. No gross focal neurologic deficits Skin:  Skin is warm, dry and intact.  Psychiatric: Mood and affect are normal.  ____________________________________________   INITIAL IMPRESSION / ASSESSMENT AND PLAN / ED COURSE  Pertinent labs & imaging results that were available during my care of the patient were reviewed by me and considered in my medical decision making (see chart for details).  Patient presents to the emergency department with left flank pain, ongoing but intermittent for the past 1 week.  Differential would include kidney stone, urinary tract infection, pyelonephritis or colitis.  Patient's lab work is largely within normal limits, slight leukocytosis.  Patient overall appears well, states she is feeling somewhat better after pain medication.  I discussed the options with the patient as far as discharged home with a short course of pain medication follow-up with her doctor and returning for any worsening pain or fever, versus obtaining CT imaging in the emergency department.  Patient would prefer to go home with a trial of pain medication and will return if pain worsens or she develops a fever.  ____________________________________________   FINAL CLINICAL IMPRESSION(S) / ED DIAGNOSES  Flank pain    Minna Antis, MD 07/21/17 1344

## 2018-01-01 ENCOUNTER — Ambulatory Visit: Payer: Self-pay | Admitting: Family Medicine

## 2018-01-19 ENCOUNTER — Other Ambulatory Visit: Payer: Self-pay

## 2018-01-19 ENCOUNTER — Encounter: Payer: Self-pay | Admitting: Emergency Medicine

## 2018-01-19 ENCOUNTER — Emergency Department
Admission: EM | Admit: 2018-01-19 | Discharge: 2018-01-19 | Disposition: A | Discharge status: Home / Self Care | Payer: Medicaid Other | Attending: Emergency Medicine | Admitting: Emergency Medicine

## 2018-01-19 DIAGNOSIS — K5090 Crohn's disease, unspecified, without complications: Secondary | ICD-10-CM | POA: Insufficient documentation

## 2018-01-19 DIAGNOSIS — J45909 Unspecified asthma, uncomplicated: Secondary | ICD-10-CM | POA: Diagnosis not present

## 2018-01-19 DIAGNOSIS — F17210 Nicotine dependence, cigarettes, uncomplicated: Secondary | ICD-10-CM | POA: Insufficient documentation

## 2018-01-19 DIAGNOSIS — F419 Anxiety disorder, unspecified: Secondary | ICD-10-CM | POA: Insufficient documentation

## 2018-01-19 DIAGNOSIS — Z79899 Other long term (current) drug therapy: Secondary | ICD-10-CM | POA: Insufficient documentation

## 2018-01-19 DIAGNOSIS — Z760 Encounter for issue of repeat prescription: Secondary | ICD-10-CM | POA: Diagnosis present

## 2018-01-19 DIAGNOSIS — F4325 Adjustment disorder with mixed disturbance of emotions and conduct: Secondary | ICD-10-CM

## 2018-01-19 LAB — COMPREHENSIVE METABOLIC PANEL
ALBUMIN: 3.7 g/dL (ref 3.5–5.0)
ALT: 10 U/L (ref 0–44)
ANION GAP: 7 (ref 5–15)
AST: 16 U/L (ref 15–41)
Alkaline Phosphatase: 77 U/L (ref 38–126)
BILIRUBIN TOTAL: 0.5 mg/dL (ref 0.3–1.2)
BUN: 13 mg/dL (ref 6–20)
CO2: 20 mmol/L — ABNORMAL LOW (ref 22–32)
Calcium: 8.5 mg/dL — ABNORMAL LOW (ref 8.9–10.3)
Chloride: 109 mmol/L (ref 98–111)
Creatinine, Ser: 0.52 mg/dL (ref 0.44–1.00)
GFR calc Af Amer: >60 mL/min (ref >60)
Glucose, Bld: 99 mg/dL (ref 70–99)
POTASSIUM: 4.1 mmol/L (ref 3.5–5.1)
Sodium: 136 mmol/L (ref 135–145)
TOTAL PROTEIN: 7.8 g/dL (ref 6.5–8.1)

## 2018-01-19 LAB — ACETAMINOPHEN LEVEL

## 2018-01-19 LAB — CBC
HEMATOCRIT: 40.9 % (ref 36.0–46.0)
HEMOGLOBIN: 12.6 g/dL (ref 12.0–15.0)
MCH: 26.8 pg (ref 26.0–34.0)
MCHC: 30.8 g/dL (ref 30.0–36.0)
MCV: 87 fL (ref 80.0–100.0)
NRBC: 0 % (ref 0.0–0.2)
Platelets: 245 10*3/uL (ref 150–400)
RBC: 4.7 MIL/uL (ref 3.87–5.11)
RDW: 16.3 % — ABNORMAL HIGH (ref 11.5–15.5)
WBC: 10.5 10*3/uL (ref 4.0–10.5)

## 2018-01-19 LAB — ETHANOL

## 2018-01-19 LAB — SALICYLATE LEVEL: Salicylate Lvl: <7 mg/dL (ref 2.8–30.0)

## 2018-01-19 NOTE — ED Notes (Addendum)
Patient assigned to appropriate care area   Introduced self to pt  Patient oriented to unit/care area: Informed that, for their safety, care areas are designed for safety and visiting and phone hours explained to patient. Patient verbalizes understanding, and verbal contract for safety obtained  Environment secured   Pt presents to ED, pt states she needs her medications refilled. Pt states she needs adderall and valium refilled, states has not been on her meds in months. Pt states she has been "putting a block up" to keep her from "hitting the elderly". Patient says the elderly lady is her husbands ex-wife ( "Cherylann Ratel Vicks"), . Pt states thoughts of wanting to hurt "Cherylann Ratel Vicks", pt states "I had to stay away from all of the people out there because I just kept seeing her face".

## 2018-01-19 NOTE — Discharge Instructions (Addendum)
Follow-up at [facility] to get set up with a new doctor and started back on medications.  Return to the ER for any new or worsening symptoms that concern you such as worsening anxiety, any thoughts of wanting to hurt yourself or anyone else, or any acute medical complaints.

## 2018-01-19 NOTE — ED Triage Notes (Signed)
Pt presents to ED via POV, pt states she needs her medications refilled. Pt states she needs adderall and valium refilled, states has not been on her meds in months. Pt states she has been "putting a block up" to keep her from "hitting the elderly". Pt states thoughts of wanting to hurt "Cherylann Ratel Vicks", pt states "I had to stay away from all of the people out there because I just kept seeing her face".

## 2018-01-19 NOTE — ED Notes (Signed)
Patient talking to the ER doctor

## 2018-01-19 NOTE — ED Notes (Signed)
First Nurse Note: Patient ambulatory to Stat desk stating she wants to leave and go to [facility] clinic.  Patient reassured that she is in the right place and she agrees to stay.
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
## 2018-01-19 NOTE — ED Notes (Signed)
Personal Belongings  Nature conservation officer Black shoes Wallace [employer]ull[employer] and black snake colored pants  1 pair White socks Gray and pink jacket Tan hair tie [employer] with crack screen [employer]

## 2018-01-19 NOTE — ED Notes (Signed)

## 2018-01-19 NOTE — BH Assessment (Signed)
Writer was unable to complete TTS Consult. Patient was seen by Psych MD and discharged before writer was able to talk with the patient.

## 2018-02-02 ENCOUNTER — Encounter: Payer: Self-pay | Admitting: *Deleted

## 2018-02-02 ENCOUNTER — Encounter: Payer: Self-pay | Admitting: Family Medicine

## 2018-02-02 ENCOUNTER — Ambulatory Visit: Payer: Medicaid Other | Admitting: Family Medicine

## 2018-02-02 VITALS — BP 112/74 | HR 90 | Temp 97.8°F | Ht 64.5 in | Wt 120.0 lb

## 2018-02-02 DIAGNOSIS — F17200 Nicotine dependence, unspecified, uncomplicated: Secondary | ICD-10-CM | POA: Insufficient documentation

## 2018-02-02 DIAGNOSIS — F909 Attention-deficit hyperactivity disorder, unspecified type: Secondary | ICD-10-CM

## 2018-02-02 DIAGNOSIS — F3132 Bipolar disorder, current episode depressed, moderate: Secondary | ICD-10-CM

## 2018-02-02 DIAGNOSIS — L03818 Cellulitis of other sites: Secondary | ICD-10-CM | POA: Insufficient documentation

## 2018-02-02 DIAGNOSIS — L299 Pruritus, unspecified: Secondary | ICD-10-CM

## 2018-02-02 DIAGNOSIS — Z13220 Encounter for screening for lipoid disorders: Secondary | ICD-10-CM

## 2018-02-02 DIAGNOSIS — M549 Dorsalgia, unspecified: Secondary | ICD-10-CM | POA: Insufficient documentation

## 2018-02-02 DIAGNOSIS — I959 Hypotension, unspecified: Secondary | ICD-10-CM | POA: Insufficient documentation

## 2018-02-02 DIAGNOSIS — Z23 Encounter for immunization: Secondary | ICD-10-CM | POA: Diagnosis not present

## 2018-02-02 DIAGNOSIS — G5603 Carpal tunnel syndrome, bilateral upper limbs: Secondary | ICD-10-CM

## 2018-02-02 DIAGNOSIS — F410 Panic disorder [episodic paroxysmal anxiety] without agoraphobia: Secondary | ICD-10-CM | POA: Insufficient documentation

## 2018-02-02 DIAGNOSIS — G894 Chronic pain syndrome: Secondary | ICD-10-CM | POA: Diagnosis not present

## 2018-02-02 DIAGNOSIS — H900 Conductive hearing loss, bilateral: Secondary | ICD-10-CM

## 2018-02-02 DIAGNOSIS — J329 Chronic sinusitis, unspecified: Secondary | ICD-10-CM | POA: Insufficient documentation

## 2018-02-02 DIAGNOSIS — J309 Allergic rhinitis, unspecified: Secondary | ICD-10-CM | POA: Insufficient documentation

## 2018-02-02 DIAGNOSIS — H60502 Unspecified acute noninfective otitis externa, left ear: Secondary | ICD-10-CM | POA: Insufficient documentation

## 2018-02-02 DIAGNOSIS — G8929 Other chronic pain: Secondary | ICD-10-CM | POA: Insufficient documentation

## 2018-02-02 DIAGNOSIS — R202 Paresthesia of skin: Secondary | ICD-10-CM

## 2018-02-02 DIAGNOSIS — H66001 Acute suppurative otitis media without spontaneous rupture of ear drum, right ear: Secondary | ICD-10-CM

## 2018-02-02 DIAGNOSIS — K5010 Crohn's disease of large intestine without complications: Secondary | ICD-10-CM | POA: Diagnosis not present

## 2018-02-02 DIAGNOSIS — M797 Fibromyalgia: Secondary | ICD-10-CM

## 2018-02-02 DIAGNOSIS — B86 Scabies: Secondary | ICD-10-CM

## 2018-02-02 DIAGNOSIS — F4312 Post-traumatic stress disorder, chronic: Secondary | ICD-10-CM

## 2018-02-02 DIAGNOSIS — R200 Anesthesia of skin: Secondary | ICD-10-CM

## 2018-02-02 DIAGNOSIS — R8281 Pyuria: Secondary | ICD-10-CM

## 2018-02-02 HISTORY — DX: Scabies: B86

## 2018-02-02 HISTORY — DX: Acute suppurative otitis media without spontaneous rupture of ear drum, right ear: H66.001

## 2018-02-02 HISTORY — DX: Unspecified acute noninfective otitis externa, left ear: H60.502

## 2018-02-02 HISTORY — DX: Pruritus, unspecified: L29.9

## 2018-02-02 HISTORY — DX: Cellulitis of other sites: L03.818

## 2018-02-02 NOTE — Patient Instructions (Signed)
Tdap Vaccine (Tetanus, Diphtheria and Pertussis): What You Need to Know 1. Why get vaccinated? Tetanus, diphtheria and pertussis are very serious diseases. Tdap vaccine can protect [place] from these diseases. And, Tdap vaccine given to pregnant women can protect newborn babies against pertussis.Marland Kitchen TETANUS (Lockjaw) is rare in the [place] today. It causes painful muscle tightening and stiffness, usually all over the body.  It can lead to tightening of muscles in the head and neck so you can't open your mouth, swallow, or sometimes even breathe. Tetanus kills about 1 out of 10 people who are infected even after receiving the best medical care. DIPHTHERIA is also rare in the [place] today. It can cause a thick coating to form in the back of the throat.  It can lead to breathing problems, heart failure, paralysis, and death. PERTUSSIS (Whooping Cough) causes severe coughing spells, which can cause difficulty breathing, vomiting and disturbed sleep.  It can also lead to weight loss, incontinence, and rib fractures. Up to 2 in 100 adolescents and 5 in 100 adults with pertussis are hospitalized or have complications, which could include pneumonia or death. These diseases are caused by bacteria. Diphtheria and pertussis are spread from person to person through secretions from coughing or sneezing. Tetanus enters the body through cuts, scratches, or wounds. Before vaccines, as many as 200,000 cases of diphtheria, 200,000 cases of pertussis, and hundreds of cases of tetanus, were reported in the [place] each year. Since vaccination began, reports of cases for tetanus and diphtheria have dropped by about 99% and for pertussis by about 80%. 2. Tdap vaccine Tdap vaccine can protect adolescents and adults from tetanus, diphtheria, and pertussis. One dose of Tdap is routinely given at age 61 or 77. People who did not get Tdap at that age should get it as soon as possible. Tdap is especially important  for healthcare professionals and anyone having close contact with a baby younger than 12 months. Pregnant women should get a dose of Tdap during every pregnancy, to protect the newborn from pertussis. Infants are most at risk for severe, life-threatening complications from pertussis. Another vaccine, called Td, protects against tetanus and diphtheria, but not pertussis. A Td booster should be given every 10 years. Tdap may be given as one of these boosters if you have never gotten Tdap before. Tdap may also be given after a severe cut or burn to prevent tetanus infection. Your doctor or the person giving you the vaccine can give you more information. Tdap may safely be given at the same time as other vaccines. 3. Some people should not get this vaccine  A person who has ever had a life-threatening allergic reaction after a previous dose of any diphtheria, tetanus or pertussis containing vaccine, OR has a severe allergy to any part of this vaccine, should not get Tdap vaccine. Tell the person giving the vaccine about any severe allergies.  Anyone who had coma or long repeated seizures within 7 days after a childhood dose of DTP or DTaP, or a previous dose of Tdap, should not get Tdap, unless a cause other than the vaccine was found. They can still get Td.  Talk to your doctor if you: ? have seizures or another nervous system problem, ? had severe pain or swelling after any vaccine containing diphtheria, tetanus or pertussis, ? ever had a condition called Guillain-Barr Syndrome (GBS), ? aren't feeling well on the day the shot is scheduled. 4. Risks With any medicine, including vaccines, there is  a chance of side effects. These are usually mild and go away on their own. Serious reactions are also possible but are rare. Most people who get Tdap vaccine do not have any problems with it. Mild problems following Tdap (Did not interfere with activities)  Pain where the shot was given (about 3 in 4  adolescents or 2 in 3 adults)  Redness or swelling where the shot was given (about 1 person in 5)  Mild fever of at least 100.40F (up to about 1 in 25 adolescents or 1 in 100 adults)  Headache (about 3 or 4 people in 10)  Tiredness (about 1 person in 3 or 4)  Nausea, vomiting, diarrhea, stomach ache (up to 1 in 4 adolescents or 1 in 10 adults)  Chills, sore joints (about 1 person in 10)  Body aches (about 1 person in 3 or 4)  Rash, swollen glands (uncommon) Moderate problems following Tdap (Interfered with activities, but did not require medical attention)  Pain where the shot was given (up to 1 in 5 or 6)  Redness or swelling where the shot was given (up to about 1 in 16 adolescents or 1 in 12 adults)  Fever over 102F (about 1 in 100 adolescents or 1 in 250 adults)  Headache (about 1 in 7 adolescents or 1 in 10 adults)  Nausea, vomiting, diarrhea, stomach ache (up to 1 or 3 people in 100)  Swelling of the entire arm where the shot was given (up to about 1 in 500). Severe problems following Tdap (Unable to perform usual activities; required medical attention)  Swelling, severe pain, bleeding and redness in the arm where the shot was given (rare). Problems that could happen after any vaccine:  People sometimes faint after a medical procedure, including vaccination. Sitting or lying down for about 15 minutes can help prevent fainting, and injuries caused by a fall. Tell your doctor if you feel dizzy, or have vision changes or ringing in the ears.  Some people get severe pain in the shoulder and have difficulty moving the arm where a shot was given. This happens very rarely.  Any medication can cause a severe allergic reaction. Such reactions from a vaccine are very rare, estimated at fewer than 1 in a million doses, and would happen within a few minutes to a few hours after the vaccination. As with any medicine, there is a very remote chance of a vaccine causing a serious  injury or death. The safety of vaccines is always being monitored. For more information, visit: http://www.aguilar.org/ 5. What if there is a serious problem? What should I look for?  Look for anything that concerns you, such as signs of a severe allergic reaction, very high fever, or unusual behavior. Signs of a severe allergic reaction can include hives, swelling of the face and throat, difficulty breathing, a fast heartbeat, dizziness, and weakness. These would usually start a few minutes to a few hours after the vaccination. What should I do?  If you think it is a severe allergic reaction or other emergency that can't wait, call 9-1-1 or get the person to the nearest hospital. Otherwise, call your doctor.  Afterward, the reaction should be reported to the Vaccine Adverse Event Reporting System (VAERS). Your doctor might file this report, or you can do it yourself through the VAERS web site at www.vaers.SamedayNews.es, or by calling 302-685-4097. VAERS does not give medical advice. 6. The National Vaccine Injury Compensation Program The [employer] Vaccine Injury Compensation Program ([place]) is  a federal program that was created to compensate people who may have been injured by certain vaccines. Persons who believe they may have been injured by a vaccine can learn about the program and about filing a claim by calling 954 296 2741 or visiting the [place] website at GoldCloset.com.ee. There is a time limit to file a claim for compensation. 7. How can I learn more?  Ask your doctor. He or she can give you the vaccine package insert or suggest other sources of information.  Call your local or state health department.  Contact the Centers for Disease Control and Prevention (CDC): ? Call 570-213-0381 (1-800-CDC-INFO) or ? Visit CDC's website at http://hunter.com/ Vaccine Information Statement Tdap Vaccine (03/15/2013) This information is not intended to replace advice given to you  by your health care provider. Make sure you discuss any questions you have with your health care provider. Document Released: 07/08/2011 Document Revised: 08/24/2017 Document Reviewed: 08/24/2017 Elsevier Interactive Patient Education  2019 [place]. Influenza (Flu) Vaccine (Inactivated or Recombinant): What You Need to Know 1. Why get vaccinated? Influenza vaccine can prevent influenza (flu). Flu is a contagious disease that spreads around the [place] every year, usually between October and May. Anyone can get the flu, but it is more dangerous for some people. Infants and young children, people 34 years of age and older, pregnant women, and people with certain health conditions or a weakened immune system are at greatest risk of flu complications. Pneumonia, bronchitis, sinus infections and ear infections are examples of flu-related complications. If you have a medical condition, such as heart disease, cancer or diabetes, flu can make it worse. Flu can cause fever and chills, sore throat, muscle aches, fatigue, cough, headache, and runny or stuffy nose. Some people may have vomiting and diarrhea, though this is more common in children than adults. Each year thousands of people in the [place] die from flu, and many more are hospitalized. Flu vaccine prevents millions of illnesses and flu-related visits to the doctor each year. 2. Influenza vaccine CDC recommends everyone 84 months of age and older get vaccinated every flu season. Children 6 months through 34 years of age may need 2 doses during a single flu season. Everyone else needs only 1 dose each flu season. It takes about 2 weeks for protection to develop after vaccination. There are many flu viruses, and they are always changing. Each year a new flu vaccine is made to protect against three or four viruses that are likely to cause disease in the upcoming flu season. Even when the vaccine doesn't exactly match these viruses, it may  still provide some protection. Influenza vaccine does not cause flu. Influenza vaccine may be given at the same time as other vaccines. 3. Talk with your health care provider Tell your vaccine provider if the person getting the vaccine:  Has had an allergic reaction after a previous dose of influenza vaccine, or has any severe, life-threatening allergies.  Has ever had Guillain-Barr Syndrome (also called GBS). In some cases, your health care provider may decide to postpone influenza vaccination to a future visit. People with minor illnesses, such as a cold, may be vaccinated. People who are moderately or severely ill should usually wait until they recover before getting influenza vaccine. Your health care provider can give you more information. 4. Risks of a vaccine reaction  Soreness, redness, and swelling where shot is given, fever, muscle aches, and headache can happen after influenza vaccine.  There may be a very small  Has ever had Guillain-Barré Syndrome (also called GBS).  In some cases, your health care provider may decide to postpone influenza vaccination to a future visit.  People with minor illnesses, such as a cold, may be vaccinated. People who are moderately or severely ill should usually wait until they recover before getting influenza vaccine.  Your health care provider can give you more information.  4. Risks of a vaccine reaction  · Soreness, redness, and swelling where shot is given, fever, muscle aches, and headache can happen after influenza vaccine.  · There may be a very small increased risk of Guillain-Barré Syndrome (GBS) after inactivated influenza vaccine (the flu shot).  Young children who get the flu shot along with pneumococcal vaccine (PCV13), and/or DTaP vaccine at the same time might be slightly more likely to have a seizure caused by fever. Tell your health care provider if a child who is getting flu vaccine has ever had a seizure.  People sometimes faint after medical procedures, including vaccination. Tell your provider if you feel dizzy or have vision changes or ringing in the ears.  As with any medicine, there is a very remote chance of a vaccine causing a severe allergic reaction, other serious injury, or death.  5. What if there is a serious problem?  An allergic reaction could occur after the vaccinated person leaves the clinic. If you see signs of a severe allergic reaction (hives, swelling of the face and throat, difficulty breathing, a fast heartbeat, dizziness, or weakness), call 9-1-1 and get the person to the nearest  hospital.  For other signs that concern you, call your health care provider.  Adverse reactions should be reported to the Vaccine Adverse Event Reporting System (VAERS). Your health care provider will usually file this report, or you can do it yourself. Visit the VAERS website at www.vaers.hhs.gov or call 1-800-822-7967.VAERS is only for reporting reactions, and VAERS staff do not give medical advice.  6. The National Vaccine Injury Compensation Program  The National Vaccine Injury Compensation Program (VICP) is a federal program that was created to compensate people who may have been injured by certain vaccines. Visit the VICP website at www.hrsa.gov/vaccinecompensation or call 1-800-338-2382 to learn about the program and about filing a claim. There is a time limit to file a claim for compensation.  7. How can I learn more?  · Ask your healthcare provider.  · Call your local or state health department.  · Contact the Centers for Disease Control and Prevention (CDC):  ? Call 1-800-232-4636 (1-800-CDC-INFO) or  ? Visit CDC's www.cdc.gov/flu  Vaccine Information Statement (Interim) Inactivated Influenza Vaccine (09/03/2017)  This information is not intended to replace advice given to you by your health care provider. Make sure you discuss any questions you have with your health care provider.  Document Released: 10/31/2005 Document Revised: 09/07/2017 Document Reviewed: 09/07/2017  Elsevier Interactive Patient Education © 2019 [employer]
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
## 2018-02-03 ENCOUNTER — Encounter: Payer: Self-pay | Admitting: Family Medicine

## 2018-02-03 DIAGNOSIS — G5603 Carpal tunnel syndrome, bilateral upper limbs: Secondary | ICD-10-CM | POA: Insufficient documentation

## 2018-02-03 DIAGNOSIS — M797 Fibromyalgia: Secondary | ICD-10-CM | POA: Insufficient documentation

## 2018-02-03 LAB — CBC WITH DIFFERENTIAL/PLATELET
Basophils Absolute: 0.1 10*3/uL (ref 0.0–0.2)
Basos: 0 %
EOS (ABSOLUTE): 0.1 10*3/uL (ref 0.0–0.4)
EOS: 1 %
HEMATOCRIT: 38.1 % (ref 34.0–46.6)
Hemoglobin: 12.3 g/dL (ref 11.1–15.9)
IMMATURE GRANULOCYTES: 0 %
Immature Grans (Abs): 0 10*3/uL (ref 0.0–0.1)
LYMPHS ABS: 2.4 10*3/uL (ref 0.7–3.1)
Lymphs: 21 %
MCH: 27 pg (ref 26.6–33.0)
MCHC: 32.3 g/dL (ref 31.5–35.7)
MCV: 84 fL (ref 79–97)
MONOCYTES: 4 %
MONOS ABS: 0.5 10*3/uL (ref 0.1–0.9)
Neutrophils Absolute: 8.6 10*3/uL — ABNORMAL HIGH (ref 1.4–7.0)
Neutrophils: 74 %
Platelets: 291 10*3/uL (ref 150–450)
RBC: 4.55 x10E6/uL (ref 3.77–5.28)
RDW: 15.3 % (ref 11.7–15.4)
WBC: 11.7 10*3/uL — AB (ref 3.4–10.8)

## 2018-02-03 LAB — LIPID PANEL W/O CHOL/HDL RATIO
Cholesterol, Total: 73 mg/dL — ABNORMAL LOW (ref 100–199)
HDL: 44 mg/dL (ref >39)
LDL CALC: 22 mg/dL (ref 0–99)
TRIGLYCERIDES: 35 mg/dL (ref 0–149)
VLDL Cholesterol Cal: 7 mg/dL (ref 5–40)

## 2018-02-03 LAB — COMPREHENSIVE METABOLIC PANEL
A/G RATIO: 1.3 (ref 1.2–2.2)
ALT: 5 IU/L (ref 0–32)
AST: 12 IU/L (ref 0–40)
Albumin: 4.2 g/dL (ref 3.5–5.5)
Alkaline Phosphatase: 81 IU/L (ref 39–117)
BUN/Creatinine Ratio: 24 — ABNORMAL HIGH (ref 9–23)
BUN: 14 mg/dL (ref 6–20)
Bilirubin Total: <0.2 mg/dL (ref 0.0–1.2)
CALCIUM: 9 mg/dL (ref 8.7–10.2)
CO2: 23 mmol/L (ref 20–29)
CREATININE: 0.59 mg/dL (ref 0.57–1.00)
Chloride: 102 mmol/L (ref 96–106)
GFR, EST AFRICAN AMERICAN: 136 mL/min/{1.73_m2} (ref >59)
GFR, EST NON AFRICAN AMERICAN: 118 mL/min/{1.73_m2} (ref >59)
GLOBULIN, TOTAL: 3.3 g/dL (ref 1.5–4.5)
Glucose: 79 mg/dL (ref 65–99)
POTASSIUM: 4.8 mmol/L (ref 3.5–5.2)
SODIUM: 138 mmol/L (ref 134–144)
TOTAL PROTEIN: 7.5 g/dL (ref 6.0–8.5)

## 2018-02-03 LAB — B12 AND FOLATE PANEL
Folate: 3.2 ng/mL (ref >3.0)
VITAMIN B 12: 466 pg/mL (ref 232–1245)

## 2018-02-03 LAB — HIV ANTIBODY (ROUTINE TESTING W REFLEX): HIV SCREEN 4TH GENERATION: NONREACTIVE

## 2018-02-03 LAB — TSH: TSH: 1.66 u[IU]/mL (ref 0.450–4.500)

## 2018-02-03 LAB — VITAMIN D 25 HYDROXY (VIT D DEFICIENCY, FRACTURES): VIT D 25 HYDROXY: 29.6 ng/mL — AB (ref 30.0–100.0)

## 2018-02-03 NOTE — Assessment & Plan Note (Signed)
Will reestablish with her psychiatrist. Already has appointment.

## 2018-02-03 NOTE — Assessment & Plan Note (Signed)
Would like to go back to see her pain management doctor. Referral generated today. Will reestablish with her psychiatrist.

## 2018-02-03 NOTE — Assessment & Plan Note (Signed)
Chronic. Has had surgery and now has a plate in place. Will attempt to get records and get her back in to see her ENT. Referral generated today.

## 2018-02-03 NOTE — Assessment & Plan Note (Signed)
Discussed that we do not treat chronic pain in this office and that opiates are not the recommended treatment for fibromyalgia. She would like to go back to see her previous pain specialist, Dr. Lennox Grumbles. Referral generated today.

## 2018-02-03 NOTE — Assessment & Plan Note (Signed)
Will refer to hand surgeon. Will attempt to obtain previous records, including EMG. Call with any concerns. Checking labs today.

## 2018-02-03 NOTE — Assessment & Plan Note (Signed)
Has not seen GI in several years. Will attempt to get records and get her in with local GI. Referral generated today. Call with any concerns.

## 2018-02-03 NOTE — Assessment & Plan Note (Signed)
Will reestablish with her psychiatrist. Already has appointment.  

## 2018-02-04 LAB — UA/M W/RFLX CULTURE, ROUTINE
BILIRUBIN UA: NEGATIVE
GLUCOSE, UA: NEGATIVE
KETONES UA: NEGATIVE
Nitrite, UA: NEGATIVE
Specific Gravity, UA: >1.03 — ABNORMAL HIGH (ref 1.005–1.030)
Urobilinogen, Ur: 0.2 mg/dL (ref 0.2–1.0)
pH, UA: 6 (ref 5.0–7.5)

## 2018-02-04 LAB — MICROSCOPIC EXAMINATION: RBC MICROSCOPIC, UA: NONE SEEN /HPF (ref 0–2)

## 2018-02-04 LAB — BAYER DCA HB A1C WAIVED: HB A1C (BAYER DCA - WAIVED): 4.9 % (ref <7.0)

## 2018-02-04 LAB — URINE CULTURE, REFLEX

## 2018-02-17 ENCOUNTER — Telehealth: Payer: Self-pay | Admitting: Nurse Practitioner

## 2018-02-17 NOTE — Telephone Encounter (Signed)
Pt called to check status of referrals [phone] or [phone]

## 2018-02-18 ENCOUNTER — Telehealth: Payer: Self-pay | Admitting: Family Medicine

## 2018-02-18 NOTE — Telephone Encounter (Signed)
There are several reasons that she may be tired including her mood issues, her fibromyalgia and her medications. We can discuss it further at her appointment.

## 2018-02-18 NOTE — Telephone Encounter (Signed)
Spoke to patient not our patient given a number for [facility].

## 2018-02-18 NOTE — Telephone Encounter (Signed)
Called and left a message for patient to return my call.  [place] for [facility] nurse triage to discuss with patient CRM created

## 2018-02-18 NOTE — Telephone Encounter (Signed)
Patient notified

## 2018-02-18 NOTE — Telephone Encounter (Signed)
Pt.asking if all her labs are normal, what could be causing lethargy? Please advise pt.

## 2018-02-21 ENCOUNTER — Emergency Department
Admission: EM | Admit: 2018-02-21 | Discharge: 2018-02-21 | Disposition: A | Discharge status: Home / Self Care | Payer: Medicaid Other | Attending: Emergency Medicine | Admitting: Emergency Medicine

## 2018-02-21 ENCOUNTER — Encounter: Payer: Self-pay | Admitting: Emergency Medicine

## 2018-02-21 ENCOUNTER — Other Ambulatory Visit: Payer: Self-pay

## 2018-02-21 DIAGNOSIS — Z5321 Procedure and treatment not carried out due to patient leaving prior to being seen by health care provider: Secondary | ICD-10-CM | POA: Insufficient documentation

## 2018-02-21 DIAGNOSIS — R102 Pelvic and perineal pain: Secondary | ICD-10-CM | POA: Insufficient documentation

## 2018-02-21 LAB — CBC
HCT: 37.8 % (ref 36.0–46.0)
Hemoglobin: 11.6 g/dL — ABNORMAL LOW (ref 12.0–15.0)
MCH: 27.1 pg (ref 26.0–34.0)
MCHC: 30.7 g/dL (ref 30.0–36.0)
MCV: 88.3 fL (ref 80.0–100.0)
Platelets: 238 10*3/uL (ref 150–400)
RBC: 4.28 MIL/uL (ref 3.87–5.11)
RDW: 15.9 % — ABNORMAL HIGH (ref 11.5–15.5)
WBC: 9.8 10*3/uL (ref 4.0–10.5)
nRBC: 0 % (ref 0.0–0.2)

## 2018-02-21 LAB — COMPREHENSIVE METABOLIC PANEL
ALBUMIN: 3.7 g/dL (ref 3.5–5.0)
ALT: 13 U/L (ref 0–44)
AST: 14 U/L — ABNORMAL LOW (ref 15–41)
Alkaline Phosphatase: 65 U/L (ref 38–126)
Anion gap: 7 (ref 5–15)
BILIRUBIN TOTAL: 0.4 mg/dL (ref 0.3–1.2)
BUN: 8 mg/dL (ref 6–20)
CO2: 27 mmol/L (ref 22–32)
Calcium: 8.6 mg/dL — ABNORMAL LOW (ref 8.9–10.3)
Chloride: 106 mmol/L (ref 98–111)
Creatinine, Ser: 0.65 mg/dL (ref 0.44–1.00)
GFR calc Af Amer: >60 mL/min (ref >60)
GFR calc non Af Amer: >60 mL/min (ref >60)
Glucose, Bld: 91 mg/dL (ref 70–99)
Potassium: 3.3 mmol/L — ABNORMAL LOW (ref 3.5–5.1)
Sodium: 140 mmol/L (ref 135–145)
Total Protein: 7.2 g/dL (ref 6.5–8.1)

## 2018-02-21 LAB — LIPASE, BLOOD: Lipase: 38 U/L (ref 11–51)

## 2018-02-21 MED ORDER — SODIUM CHLORIDE 0.9% FLUSH: 3.0000 mL | Freq: Once | INTRAVENOUS | Status: DC

## 2018-02-21 NOTE — ED Triage Notes (Signed)
Pt to ED via POV c/o lower abdominal and pelvic pain. Pt states that she has had N/V/D. Pt states that when she eat she gets "big knots where I can't eat and it come right back up". Pt is in NAD at this time.

## 2018-02-22 ENCOUNTER — Ambulatory Visit: Payer: Self-pay | Admitting: Family Medicine

## 2018-03-02 ENCOUNTER — Ambulatory Visit: Payer: Medicaid Other | Admitting: Family Medicine

## 2018-03-04 ENCOUNTER — Other Ambulatory Visit: Payer: Self-pay

## 2018-03-04 ENCOUNTER — Ambulatory Visit: Payer: Self-pay | Admitting: Gastroenterology

## 2018-03-04 ENCOUNTER — Encounter: Payer: Self-pay | Admitting: Gastroenterology

## 2018-03-04 ENCOUNTER — Ambulatory Visit (INDEPENDENT_AMBULATORY_CARE_PROVIDER_SITE_OTHER): Payer: Medicaid Other | Admitting: Family Medicine

## 2018-03-04 ENCOUNTER — Encounter: Payer: Self-pay | Admitting: Family Medicine

## 2018-03-04 VITALS — BP 90/60 | HR 101 | Temp 98.2°F | Ht 64.5 in | Wt 126.0 lb

## 2018-03-04 DIAGNOSIS — N3001 Acute cystitis with hematuria: Secondary | ICD-10-CM

## 2018-03-04 DIAGNOSIS — K5010 Crohn's disease of large intestine without complications: Secondary | ICD-10-CM | POA: Diagnosis not present

## 2018-03-04 DIAGNOSIS — R300 Dysuria: Secondary | ICD-10-CM

## 2018-03-04 DIAGNOSIS — H53149 Visual discomfort, unspecified: Secondary | ICD-10-CM

## 2018-03-04 DIAGNOSIS — N8110 Cystocele, unspecified: Secondary | ICD-10-CM

## 2018-03-04 MED ORDER — CIPROFLOXACIN HCL 500 MG PO TABS: 500.0000 mg | ORAL_TABLET | Freq: Two times a day (BID) | ORAL | 0 refills | Status: DC

## 2018-03-04 NOTE — Assessment & Plan Note (Signed)
To see GI this afternoon. Call with any concerns.

## 2018-03-04 NOTE — Assessment & Plan Note (Signed)
Will get her into see GYN for evaluation. Call with any concerns.

## 2018-03-04 NOTE — Patient Instructions (Signed)
Pelvic Organ Prolapse Pelvic organ prolapse is the stretching, bulging, or dropping of pelvic organs into an abnormal position. It happens when the muscles and tissues that surround and support pelvic structures become weak or stretched. Pelvic organ prolapse can involve the:  Vagina (vaginal prolapse).  Uterus (uterine prolapse).  Bladder (cystocele).  Rectum (rectocele).  Intestines (enterocele). When organs other than the vagina are involved, they often bulge into the vagina or protrude from the vagina, depending on how severe the prolapse is. What are the causes? This condition may be caused by:  Pregnancy, labor, and childbirth.  Past pelvic surgery.  Decreased production of the hormone estrogen associated with menopause.  Consistently lifting more than 50 lb (23 kg).  Obesity.  Long-term inability to pass stool (chronic constipation).  A cough that lasts a long time (chronic).  Buildup of fluid in the abdomen due to certain diseases and other conditions. What are the signs or symptoms? Symptoms of this condition include:  Passing a little urine (loss of bladder control) when you cough, sneeze, strain, and exercise (stress incontinence). This may be worse immediately after childbirth. It may gradually improve over time.  Feeling pressure in your pelvis or vagina. This pressure may increase when you cough or when you are passing stool.  A bulge that protrudes from the opening of your vagina.  Difficulty passing urine or stool.  Pain in your lower back.  Pain, discomfort, or disinterest in sex.  Repeated bladder infections (urinary tract infections).  Difficulty inserting a tampon. In some people, this condition causes no symptoms. How is this diagnosed? This condition may be diagnosed based on a vaginal and rectal exam. During the exam, you may be asked to cough and strain while you are lying down, sitting, and standing up. Your health care provider will  determine if other tests are required, such as bladder function tests. How is this treated? Treatment for this condition may depend on your symptoms. Treatment may include:  Lifestyle changes, such as changes to your diet.  Emptying your bladder at scheduled times (bladder training therapy). This can help reduce or avoid urinary incontinence.  Estrogen. Estrogen may help mild prolapse by increasing the strength and tone of pelvic floor muscles.  Kegel exercises. These may help mild cases of prolapse by strengthening and tightening the muscles of the pelvic floor.  A soft, flexible device that helps support the vaginal walls and keep pelvic organs in place (pessary). This is inserted into your vagina by your health care provider.  Surgery. This is often the only form of treatment for severe prolapse. Follow these instructions at home:  Avoid drinking beverages that contain caffeine or alcohol.  Increase your intake of high-fiber foods. This can help decrease constipation and straining during bowel movements.  Lose weight if recommended by your health care provider.  Wear a sanitary pad or adult diapers if you have urinary incontinence.  Avoid heavy lifting and straining with exercise and work. Do not hold your breath when you perform mild to moderate lifting and exercise activities. Limit your activities as directed by your health care provider.  Do Kegel exercises as directed by your health care provider. To do this: ? Squeeze your pelvic floor muscles tight. You should feel a tight lift in your rectal area and a tightness in your vaginal area. Keep your stomach, buttocks, and legs relaxed. ? Hold the muscles tight for up to 10 seconds. ? Relax your muscles. ? Repeat this exercise 50 times a day,  or as many times as told by your health care provider. Continue to do this exercise for at least 4-6 weeks, or for as long as told by your health care provider.  Take over-the-counter and  prescription medicines only as told by your health care provider.  If you have a pessary, take care of it as told by your health care provider.  Keep all follow-up visits as told by your health care provider. This is important. Contact a health care provider if you:  Have symptoms that interfere with your daily activities or sex life.  Need medicine to help with the discomfort.  Notice bleeding from your vagina that is not related to your period.  Have a fever.  Have pain or bleeding when you urinate.  Have bleeding when you pass stool.  Pass urine when you have sex.  Have chronic constipation.  Have a pessary that falls out.  Have bad smelling vaginal discharge.  Have an unusual, low pain in your abdomen. Summary  Pelvic organ prolapse is the stretching, bulging, or dropping of pelvic organs into an abnormal position. It happens when the muscles and tissues that surround and support pelvic structures become weak or stretched.  When organs other than the vagina are involved, they often bulge into the vagina or protrude from the vagina, depending on how severe the prolapse is.  In most cases, this condition needs to be treated only if it produces symptoms. Treatment may include lifestyle changes, estrogen, Kegel exercises, pessary insertion, or surgery.  Avoid heavy lifting and straining with exercise and work. Do not hold your breath when you perform mild to moderate lifting and exercise activities. Limit your activities as directed by your health care provider. This information is not intended to replace advice given to you by your health care provider. Make sure you discuss any questions you have with your health care provider. Document Released: 08/03/2013 Document Revised: 01/28/2017 Document Reviewed: 01/28/2017 Elsevier Interactive Patient Education  2019 [employer].

## 2018-03-04 NOTE — Progress Notes (Addendum)
BP 90/60 (BP Location: Left Arm, Patient Position: Sitting, Cuff Size: Normal)   Pulse (!) 101   Temp 98.2 F (36.8 C) (Oral)   Ht 5' 4.5" (1.638 m)   Wt 126 lb (57.2 kg)   LMP 02/02/2018 (Exact Date)   SpO2 97%   BMI 21.29 kg/m    Subjective:    Patient ID: Sherry Gomez, female    DOB: 25-Feb-1980, 38 y.o.   MRN: [id number]  HPI: Sherry Gomez is a 38 y.o. female  Chief Complaint  Patient presents with  . Vaginal Irritation    Patient states "I feel like my bladder could fall out"  . Dysuria   She states that she feels like there is something down in her vaginal area when she wipes. She notes that it is there all the time. Her husband notices it and states that she cannot have sex because of it. It is not painful. Has had no vaginal bleeding, but notes that it is concerning for her. 7lbs was her biggest baby- has had 4 kids  Has been feeling nauseous and dizzy all the time. Seeing her GI doctor today.   URINARY SYMPTOMS Duration: 1-2 weeks Dysuria: yes Urinary frequency: yes Urgency: yes Small volume voids: no Symptom severity: no Urinary incontinence: no Foul odor: no Hematuria: no Abdominal pain: yes Back pain: yes Suprapubic pain/pressure: yes Flank pain: no Fever:  no Vomiting: no Relief with cranberry juice: no Relief with pyridium: no Status: stable Previous urinary tract infection: yes Recurrent urinary tract infection: no Sexual activity: monogomous History of sexually transmitted disease: no Vaginal discharge: no Treatments attempted: none   Depression screen [facility] 2/9 03/04/2018 02/02/2018  Decreased Interest 3 1  Down, Depressed, Hopeless 0 1  PHQ - 2 Score 3 2  Altered sleeping 2 1  Tired, decreased energy 3 1  Change in appetite 2 1  Feeling bad or failure about yourself  0 0  Trouble concentrating 0 1  Moving slowly or fidgety/restless 1 1  Suicidal thoughts 0 0  PHQ-9 Score 11 7  Difficult doing work/chores Very difficult Very difficult     GAD 7 : Generalized Anxiety Score 03/04/2018 02/02/2018  Nervous, Anxious, on Edge 2 1  Control/stop worrying 2 1  Worry too much - different things 2 1  Trouble relaxing 2 1  Restless 2 1  Easily annoyed or irritable 2 1  Afraid - awful might happen 0 0  Total GAD 7 Score 12 6  Anxiety Difficulty Somewhat difficult Somewhat difficult    Relevant past medical, surgical, family and social history reviewed and updated as indicated. Interim medical history since our last visit reviewed. Allergies and medications reviewed and updated.  Review of Systems  Constitutional: Negative.   Respiratory: Negative.   Cardiovascular: Negative.   Genitourinary: Positive for dysuria, frequency, urgency and vaginal bleeding. Negative for decreased urine volume, difficulty urinating, dyspareunia, enuresis, flank pain, genital sores, hematuria, menstrual problem, pelvic pain, vaginal discharge and vaginal pain.  Musculoskeletal: Positive for arthralgias, back pain and myalgias. Negative for gait problem, joint swelling, neck pain and neck stiffness.  Skin: Negative.   Neurological: Negative.   Psychiatric/Behavioral: Negative.     Per HPI unless specifically indicated above     Objective:    BP 90/60 (BP Location: Left Arm, Patient Position: Sitting, Cuff Size: Normal)   Pulse (!) 101   Temp 98.2 F (36.8 C) (Oral)   Ht 5' 4.5" (1.638 m)   Wt 126 lb (57.2 kg)  LMP 02/02/2018 (Exact Date)   SpO2 97%   BMI 21.29 kg/m   Wt Readings from Last 3 Encounters:  03/04/18 126 lb (57.2 kg)  02/21/18 129 lb (58.5 kg)  02/02/18 120 lb (54.4 kg)    Physical Exam Vitals signs and nursing note reviewed. Exam conducted with a chaperone present.  Constitutional:      General: She is not in acute distress.    Appearance: Normal appearance. She is not ill-appearing, toxic-appearing or diaphoretic.  HENT:     Head: Normocephalic and atraumatic.     Right Ear: External ear normal.     Left Ear:  External ear normal.     Nose: Nose normal.     Mouth/Throat:     Mouth: Mucous membranes are moist.     Pharynx: Oropharynx is clear.  Eyes:     General: No scleral icterus.       Right eye: No discharge.        Left eye: No discharge.     Extraocular Movements: Extraocular movements intact.     Conjunctiva/sclera: Conjunctivae normal.     Pupils: Pupils are equal, round, and reactive to light.  Neck:     Musculoskeletal: Normal range of motion and neck supple.  Cardiovascular:     Rate and Rhythm: Normal rate and regular rhythm.     Pulses: Normal pulses.     Heart sounds: Normal heart sounds. No murmur. No friction rub. No gallop.   Pulmonary:     Effort: Pulmonary effort is normal. No respiratory distress.     Breath sounds: Normal breath sounds. No stridor. No wheezing, rhonchi or rales.  Chest:     Chest wall: No tenderness.  Abdominal:     Hernia: There is no hernia in the right inguinal area or left inguinal area.  Genitourinary:    Labia:        Right: No rash, tenderness, lesion or injury.        Left: No rash, tenderness, lesion or injury.      Urethra: Prolapse and urethral swelling present. No urethral pain or urethral lesion.       Comments: On menses Musculoskeletal: Normal range of motion.  Lymphadenopathy:     Lower Body: No right inguinal adenopathy. No left inguinal adenopathy.  Skin:    General: Skin is warm and dry.     Capillary Refill: Capillary refill takes less than 2 seconds.     Coloration: Skin is not jaundiced or pale.     Findings: No bruising, erythema, lesion or rash.  Neurological:     General: No focal deficit present.     Mental Status: She is alert and oriented to person, place, and time. Mental status is at baseline.  Psychiatric:        Mood and Affect: Mood normal.        Behavior: Behavior normal.        Thought Content: Thought content normal.        Judgment: Judgment normal.     Results for orders placed or performed  during the hospital encounter of 02/21/18  Lipase, blood  Result Value Ref Range   Lipase 38 11 - 51 U/L  Comprehensive metabolic panel  Result Value Ref Range   Sodium 140 135 - 145 mmol/L   Potassium 3.3 (L) 3.5 - 5.1 mmol/L   Chloride 106 98 - 111 mmol/L   CO2 27 22 - 32 mmol/L   Glucose, Bld 91 70 -  99 mg/dL   BUN 8 6 - 20 mg/dL   Creatinine, Ser 4.090.65 0.44 - 1.00 mg/dL   Calcium 8.6 (L) 8.9 - 10.3 mg/dL   Total Protein 7.2 6.5 - 8.1 g/dL   Albumin 3.7 3.5 - 5.0 g/dL   AST 14 (L) 15 - 41 U/L   ALT 13 0 - 44 U/L   Alkaline Phosphatase 65 38 - 126 U/L   Total Bilirubin 0.4 0.3 - 1.2 mg/dL   GFR calc non Af Amer >60 >60 mL/min   GFR calc Af Amer >60 >60 mL/min   Anion gap 7 5 - 15  CBC  Result Value Ref Range   WBC 9.8 4.0 - 10.5 K/uL   RBC 4.28 3.87 - 5.11 MIL/uL   Hemoglobin 11.6 (L) 12.0 - 15.0 g/dL   HCT 81.137.8 91.436.0 - 78.246.0 %   MCV 88.3 80.0 - 100.0 fL   MCH 27.1 26.0 - 34.0 pg   MCHC 30.7 30.0 - 36.0 g/dL   RDW 95.615.9 (H) 21.311.5 - 08.615.5 %   Platelets 238 150 - 400 K/uL   nRBC 0.0 0.0 - 0.2 %      Assessment & Plan:   Problem List Items Addressed This Visit      Digestive   Crohn's disease of colon without complication (HCC)    To see GI this afternoon. Call with any concerns.         Genitourinary   Vaginal prolapse - Primary    Will get her into see GYN for evaluation. Call with any concerns.       Relevant Orders   Ambulatory referral to Obstetrics / Gynecology    Other Visit Diagnoses    Acute cystitis with hematuria       Will treat with cipro. Lots of blood- but on menses. Call with any concerns or if not getting better.    Photophobia       Would like to get into see opthalmology. Call with any concerns.    Relevant Orders   Ambulatory referral to Ophthalmology   Dysuria       +Leuks- will treat   Relevant Orders   UA/M w/rflx Culture, Routine       Follow up plan: Return in about 4 weeks (around 04/01/2018).

## 2018-03-06 ENCOUNTER — Encounter: Payer: Self-pay | Admitting: Family Medicine

## 2018-03-06 LAB — UA/M W/RFLX CULTURE, ROUTINE
Bilirubin, UA: NEGATIVE
Glucose, UA: NEGATIVE
Nitrite, UA: NEGATIVE
Specific Gravity, UA: 1.02 (ref 1.005–1.030)
Urobilinogen, Ur: 0.2 mg/dL (ref 0.2–1.0)
pH, UA: 6 (ref 5.0–7.5)

## 2018-03-06 LAB — MICROSCOPIC EXAMINATION: RBC, UA: >30 /hpf — ABNORMAL HIGH (ref 0–2)

## 2018-03-06 LAB — URINE CULTURE, REFLEX

## 2018-03-10 ENCOUNTER — Encounter: Payer: Self-pay | Admitting: Obstetrics and Gynecology

## 2018-03-10 ENCOUNTER — Ambulatory Visit (INDEPENDENT_AMBULATORY_CARE_PROVIDER_SITE_OTHER): Payer: Medicaid Other | Admitting: Obstetrics and Gynecology

## 2018-03-10 VITALS — BP 97/75 | HR 91 | Ht 64.5 in | Wt 127.0 lb

## 2018-03-10 DIAGNOSIS — N812 Incomplete uterovaginal prolapse: Secondary | ICD-10-CM | POA: Diagnosis not present

## 2018-03-10 NOTE — Progress Notes (Signed)
Obstetrics & Gynecology Office Visit   Chief Complaint:  Chief Complaint  Patient presents with  . Vaginal Prolapse    Referred by [place]    History of Present Illness:  Sherry Gomez is a 38 y.o. [phone] female who presents in consultation for evaluation of prolapse symptoms referred by Park Liter at [facility].   She reports that she has had the above symptoms for over 1 year.  Urinary Leakage Symptoms:  She reports that she does have urinary incontinence.  She does not wear a pad.    Stress incontinence:  She does report stress urinary leakage and reports this to be moderate bother.    Urgency incontinence: She does not report urine leakage with urgency  She drinks 5 caffeinated beverages a day (20 oz redbulls per patient)  Bladder Emptying/Upper Urinary Tract Symptoms:    Voiding frequency/nocturia: She typically urinates every 2 hours during the day and at night she has nocturia 1 - 2 times per night.    Bladder emptying: She  She does not splint to accomplish voiding.    Upper tract symptoms: Shedenies a history of kidney stones and denies gross hematuria.   UTI: She denies a history of frequent urinary tract infections.  Bulge Symptoms:  She admits to pressure/bulge symptoms.    Bowel Symptoms:  She has issues with loose stool secondary to a history of Crohn's disease.  She has had fistulas and peri-rectal disease.    Fecal incontinence: She denies encopresis    Defecatory dysfunction: She does not splint to accomplish a bowel movement.   She has also noted an increase in pelvic pain during the past year.  She has had 4 vaginal deliveries, no history of macrosomia, no history of operative vagina deliveries, no history of 3rd or 4th degree lacerations.  Most recent delivery was at [facility] 1 year ago.  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    Menstrual cycle shows anovulatory pattern with menses skipped some months.   Duration of cycles varies from 3 weeks to as little as 2 days.   On no hormonal contraception currently   Review of Systems: Review of Systems  Constitutional: Negative.   Gastrointestinal: Positive for abdominal pain and diarrhea. Negative for blood in stool, constipation, heartburn, melena, nausea and vomiting.  Genitourinary: Positive for frequency. Negative for dysuria and urgency.  Skin: Negative.      Past Medical History:  Past Medical History:  Diagnosis Date  . Acute otitis externa of left ear 02/02/2018  . Acute suppr otitis media w/o spon rupt ear drum, right ear 02/02/2018  . Allergy   . AMA (advanced maternal age) multigravida 38+, first trimester 09/23/2016   09/23/16 GC for AMA.  Patient elects for cfDNA (Mat21).  Carrier screening deferred to be discussed @ nurse visit/MD visit. 09/23/16 GC for AMA.  Patient elects for cfDNA (Mat21).  Carrier screening deferred to be discussed @ nurse visit/MD visit.  . Anxiety   . Arthritis   . Asthma   . Blood transfusion without reported diagnosis   . Cellulitis of other sites 02/02/2018  . COPD (chronic obstructive pulmonary disease) ([place])   . Crohn's disease ([place])   . Dyspareunia 07/19/2012  . Encounter for sterilization 02/19/2016   BTL consent sign on 02/18/16. Consent has been reviewed, logged in and forwarded to the [facility] on 02/19/16. Original will be taken to L&D where it will be filed until the patient delivers. Resigned with neater  signatures on 03/24/16. BTL consent sign on 02/18/16. Consent has been reviewed, logged in and forwarded to the [facility] on 02/19/16. Original will be taken to L&D where it will be filed until the patient  . Gallstones   . Infestation by Sarcoptes scabiei 02/02/2018  . Migraines   . Neuromuscular disorder ([place])   . Pruritus 02/02/2018  . Seasonal allergies   . Seizures ([place])   . Sleep apnea     Past Surgical History:  Past Surgical History:  Procedure Laterality Date  . CHOLECYSTECTOMY    . CHOLESTEATOMA  EXCISION    . EYE SURGERY    . GALLBLADDER SURGERY     "27 stones"    Gynecologic History: Patient's last menstrual period was 03/08/2018 (exact date).  Obstetric History: [id number]  Family History:  Family History  Problem Relation Age of Onset  . Cancer Other   . Heart failure Other   . Diabetes Other   . Lung disease Mother     Social History:  Social History   Socioeconomic History  . Marital status: Divorced    Spouse name: Not on file  . Number of children: Not on file  . Years of education: Not on file  . Highest education level: Not on file  Occupational History  . Not on file  Social Needs  . Financial resource strain: Not on file  . Food insecurity:    Worry: Not on file    Inability: Not on file  . Transportation needs:    Medical: Not on file    Non-medical: Not on file  Tobacco Use  . Smoking status: Current Every Day Smoker    Packs/day: 3.00    Years: 20.00    Pack years: 60.00    Types: Cigarettes  . Smokeless tobacco: Never Used  Substance and Sexual Activity  . Alcohol use: Yes    Comment: occasionally  . Drug use: No  . Sexual activity: Yes    Birth control/protection: None  Lifestyle  . Physical activity:    Days per week: Not on file    Minutes per session: Not on file  . Stress: Not on file  Relationships  . Social connections:    Talks on phone: Not on file    Gets together: Not on file    Attends religious service: Not on file    Active member of club or organization: Not on file    Attends meetings of clubs or organizations: Not on file    Relationship status: Not on file  . Intimate partner violence:    Fear of current or ex partner: Not on file    Emotionally abused: Not on file    Physically abused: Not on file    Forced sexual activity: Not on file  Other Topics Concern  . Not on file  Social History Narrative  . Not on file    Allergies:  Allergies  Allergen Reactions  . Fish Allergy Anaphylaxis  . Morphine  Other (See Comments) and Shortness Of Breath    Other reaction(s): Other (See Comments) Heaviness in chest   . Penicillins Other (See Comments), Anaphylaxis and Hives    Other reaction(s): Other (See Comments), Other (See Comments)  . Acetaminophen   . Bee Venom Swelling    Other reaction(s): Angioedema  . Darvocet [Propoxyphene N-Acetaminophen] Hives  . Diphenhydramine Hives    Other reaction(s): Other (See Comments) Pt reports she gets "spider webs on skin"   . Ibuprofen  Other reaction(s): Other (See Comments)  . Mushroom Extract Complex     Other reaction(s): Unknown  . Propoxyphene Other (See Comments)    Skin crawling sensation   . Red Dye Swelling  . Shellfish Allergy   . Sulfa Antibiotics Rash    Other reaction(s): UNKNOWN     Medications: Prior to Admission medications   Medication Sig Start Date End Date Taking? Authorizing Provider  acetaminophen (TYLENOL) 500 MG tablet Take 500 mg by mouth as needed. For pain    Yes [provider]  beclomethasone (QVAR) 40 MCG/ACT inhaler Inhale into the lungs.   Yes [provider]  cetirizine (ZYRTEC ALLERGY) 10 MG tablet Take by mouth. 06/23/17  Yes [provider]  CIPRODEX OTIC suspension INSTILL 4 DROPS INTO AU BID FOR 7 DAYS 02/28/18  Yes [provider]  adalimumab (HUMIRA) 40 MG/0.8ML injection Inject 40 mg into the skin every 14 (fourteen) days.      [provider]  albuterol (PROVENTIL HFA;VENTOLIN HFA) 108 (90 BASE) MCG/ACT inhaler Inhale 2 puffs into the lungs every 6 (six) hours as needed. For shortness of breath     [provider]  amphetamine-dextroamphetamine (ADDERALL) 10 MG tablet Take by mouth.    [provider]  ciprofloxacin (CIPRO) 500 MG tablet Take 1 tablet (500 mg total) by mouth 2 (two) times daily. Patient not taking: Reported on 03/10/2018 03/04/18   Park Liter P, DO  diazepam (VALIUM) 10 MG tablet Take 10 mg by mouth daily.       [provider]  fluticasone (FLOVENT HFA) 110 MCG/ACT inhaler Inhale into the lungs. 06/25/17   [provider]  gabapentin (NEURONTIN) 300 MG capsule Take by mouth.    [provider]  ibuprofen (ADVIL,MOTRIN) 200 MG tablet Take 800 mg by mouth every 6 (six) hours as needed. For pain     [provider]  omeprazole (PRILOSEC) 40 MG capsule Take by mouth.    [provider]  pantoprazole (PROTONIX) 20 MG tablet Take 20 mg by mouth 2 (two) times daily.    [provider]  ranitidine (ZANTAC) 150 MG tablet TAKE 1 TABLET BY MOUTH TWICE A DAY 06/23/17   [provider]  SUMAtriptan (IMITREX) 100 MG tablet Take 100 mg by mouth every 2 (two) hours as needed. For migraines     [provider]    Physical Exam Vitals:  Vitals:   03/10/18 0947  BP: 97/75  Pulse: 91   Patient's last menstrual period was 03/08/2018 (exact date).  General: NAD HEENT: normocephalic, anicteric Pulmonary: No increased work of breathing Genitourinary:  External: Normal external female genitalia.  Normal urethral meatus, normal  Bartholin's and Skene's glands.    Vagina: Normal vaginal mucosa, on having the patient strain with the labia separated there is some bulging noted of the mucosa just distal to the hymenal ring.  On speculum exam the cervix does show significant descensus with strain.  Using on half of the speculum posterior support is good, anterior there is some prolapse of the bladder.    Cervix: Grossly normal in appearance, no bleeding  Uterus: Non-enlarged, mobile, normal contour.  No CMT  Adnexa: ovaries non-enlarged, no adnexal masses  Rectal: deferred  Lymphatic: no evidence of inguinal lymphadenopathy Extremities: no edema, erythema, or tenderness Neurologic: Grossly intact Psychiatric: mood appropriate, affect full  PVR: no checked   Female chaperone present for pelvic portions of the physical exam  Assessment: 38 y.o.  [id number] presenting for  evaluation of uterine prolapse  Plan: Problem List Items Addressed This Visit    None    Visit Diagnoses    Incomplete uterovaginal prolapse    -  Primary   Relevant Orders   Ambulatory referral to Urogynecology     1) Mild uterine prolapse.  History is complicated by Crohns disesease with prior perirectal fistulas.  We discussed pessaries as an option but given that she is young, active, would like to remain sexually active she is not particularly interested in this modality.  Surgical interventions vary but may include  sacrocolpopexy +/-anterior/posterior repair, or  sacrospinous ligament suspension, simple anterior posterior repair, and possible TVT or TOT for any incontinence.  I discussed that these are not procedure that I personally perform.   - Referral TO [facility] urogynecology history of Crohns and pelvic organ prolapse  2) Pelvic pain - generally discussed that POP is not associated with pain.  The patient asked if she could have anything for pain but given that I'm only seeing her in consultation I discussed that is best managed by her PCP.    3) A total of 25 minutes were spent in face-to-face contact with the patient during this encounter with over half of that time devoted to counseling and coordination of care.  4) Return if symptoms worsen or fail to improve.    Malachy Mood, MD, [facility], [facility] 03/12/2018, 3:27 PM

## 2018-03-11 ENCOUNTER — Telehealth: Payer: Self-pay

## 2018-03-11 NOTE — Telephone Encounter (Signed)
Pt was seen in [place]; talked about surgery; ref to [facility]; also has crohn's and colitis; is regularly hurting and not getting better.  Please call her and let her know what's going on.  [phone]

## 2018-03-11 NOTE — Telephone Encounter (Signed)
I am checking with Harriett Sine, referral coordinator.

## 2018-03-11 NOTE — Telephone Encounter (Signed)
Sherry Gomez says she does not have a referral for patient. Please advise

## 2018-03-15 ENCOUNTER — Encounter: Payer: Self-pay | Admitting: Family Medicine

## 2018-03-15 ENCOUNTER — Ambulatory Visit
Admission: RE | Admit: 2018-03-15 | Discharge: 2018-03-15 | Disposition: A | Discharge status: Home / Self Care | Payer: Medicaid Other | Source: Ambulatory Visit | Attending: Family Medicine | Admitting: Family Medicine

## 2018-03-15 ENCOUNTER — Ambulatory Visit (INDEPENDENT_AMBULATORY_CARE_PROVIDER_SITE_OTHER): Payer: Medicaid Other | Admitting: Family Medicine

## 2018-03-15 ENCOUNTER — Telehealth: Payer: Self-pay | Admitting: Family Medicine

## 2018-03-15 VITALS — BP 110/73 | HR 88 | Temp 97.6°F | Wt 129.9 lb

## 2018-03-15 DIAGNOSIS — R102 Pelvic and perineal pain unspecified side: Secondary | ICD-10-CM

## 2018-03-15 DIAGNOSIS — G894 Chronic pain syndrome: Secondary | ICD-10-CM | POA: Diagnosis not present

## 2018-03-15 DIAGNOSIS — K5010 Crohn's disease of large intestine without complications: Secondary | ICD-10-CM

## 2018-03-15 DIAGNOSIS — N8110 Cystocele, unspecified: Secondary | ICD-10-CM

## 2018-03-15 MED ORDER — NORTRIPTYLINE HCL 25 MG PO CAPS: 25.0000 mg | ORAL_CAPSULE | Freq: Every day | ORAL | 3 refills | Status: DC

## 2018-03-15 MED ORDER — TRAMADOL HCL 50 MG PO TABS: 50.0000 mg | ORAL_TABLET | Freq: Three times a day (TID) | ORAL | 0 refills | Status: DC | PRN

## 2018-03-15 MED ORDER — GABAPENTIN 300 MG PO CAPS: 300.0000 mg | ORAL_CAPSULE | Freq: Three times a day (TID) | ORAL | 3 refills | Status: DC

## 2018-03-15 MED ORDER — ONDANSETRON 4 MG PO TBDP: 4.0000 mg | ORAL_TABLET | Freq: Three times a day (TID) | ORAL | 0 refills | Status: DC | PRN

## 2018-03-15 NOTE — Assessment & Plan Note (Addendum)
To get 2nd opinion with Uro-GYN at [facility]. Has been waiting for a call, but hasn't gotten one. Unclear if there is an issue with referral given her medicaid- we will re-refer to Uro-GYN. Referral generated today. Call with any concerns.

## 2018-03-15 NOTE — Assessment & Plan Note (Addendum)
PMP reviewed today. Has not had any pain medicine in 6 months, however has gotten controlled substances from 10 different providers in a variety of areas from [place] to [place] to [place], often in very short succession. Referral to pain management was made 1 month ago. Checking on her referral. Given the severity of her pain- will give her tramadol for now and follow up 1 week. Will start nortriptyline and restart her gabapentin and recheck in 1 week.

## 2018-03-15 NOTE — Telephone Encounter (Signed)
Copied from CRM [id number]. Topic: Quick Communication - Rx Refill/Question >> Mar 15, 2018 10:10 AM Crist Infante wrote: Medication: a pain med/narcotic  Pt states she is waiting for Medicaid to approve her hysterectomy and her colostomy.  Pt states she is in severe pain 24/7. Would like to know if Dr Laural Benes will write her a script for a narcotic pain med until she can get her surgery. Pt will make appt if dr Laural Benes will do this for her. Pt states her OBGYN should have sent over information >> Mar 15, 2018 10:32 AM Adela Ports M wrote: Does this pt need an appt for pain medication?

## 2018-03-15 NOTE — Telephone Encounter (Signed)
Referred to pain management in January- please check on the status of this referral.

## 2018-03-15 NOTE — Telephone Encounter (Signed)
She would need an appointment to discuss this and she is on benzos, which interact with any opiates. We would need to discuss this and this is not a guarantee that I would give her any pain medicine.

## 2018-03-15 NOTE — Assessment & Plan Note (Signed)
States that this has been much worse since her pelvic exam with GYN about 10 days ago. Unclear if this is due to her chronic pain, which also involved her legs and low back, or if this is new. She is going to see uro-gyn. Will obtain lumbar x-ray and start work up. 1 week of tramadol given today given the severity of her pain- discussed that we do not do chronic pain management in this office. She has a referral to chronic pain management already pending- we are checking on the status. Will continue her gabapentin and start her on nortriptyline. Call with any concerns. Recheck 1 week.

## 2018-03-15 NOTE — Telephone Encounter (Signed)
Scheduled an appt for this afternoon.

## 2018-03-15 NOTE — Progress Notes (Signed)
BP 110/73   Pulse 88   Temp 97.6 F (36.4 C) (Oral)   Wt 129 lb 14.4 oz (58.9 kg)   LMP 03/08/2018 (Exact Date)   SpO2 97%   BMI 21.95 kg/m    Subjective:    Patient ID: Sherry Gomez, female    DOB: [birth year], 38 y.o.   MRN: [id number]  HPI: Sherry Gomez is a 38 y.o. female  Chief Complaint  Patient presents with  . Pain    pt requesting pain medication    Tyre has a history of chronic pain- called fibromyalgia in the past. Pain was primarily located in her low back, hips, and legs, but was all over. Was on chronic pain medicine in the past. She states that this current pain is different from her previous pain. She notes that she is having pain in her pelvis, vagina and low back. It has been going on for about 10 days, it's burning and stabbing pain. She denies any cramping. She has been laying down to try to help it, and it has not really been doing anything. Some better with rest, but pain is pretty constant. It is severe and unchanging. She knows that she needs surgery, but has not heard from the surgeon yet. The gyn did not think that her pain was caused by her prolapse, as they do not usually hurt. Pain radiates into her low back and legs. She has also been nauseous and throwing up. She is otherwise doing OK with no other concerns at this time.   Relevant past medical, surgical, family and social history reviewed and updated as indicated. Interim medical history since our last visit reviewed. Allergies and medications reviewed and updated.  Review of Systems  Constitutional: Negative.   Respiratory: Negative.   Cardiovascular: Negative.   Gastrointestinal: Positive for nausea and vomiting. Negative for abdominal distention, abdominal pain, anal bleeding, blood in stool, constipation, diarrhea and rectal pain.  Genitourinary: Positive for dyspareunia, pelvic pain and vaginal pain. Negative for decreased urine volume, difficulty urinating, dysuria, enuresis, flank pain,  frequency, genital sores, hematuria, menstrual problem, urgency, vaginal bleeding and vaginal discharge.  Musculoskeletal: Positive for back pain, gait problem and myalgias. Negative for arthralgias, joint swelling, neck pain and neck stiffness.  Skin: Negative.   Psychiatric/Behavioral: Negative.     Per HPI unless specifically indicated above     Objective:    BP 110/73   Pulse 88   Temp 97.6 F (36.4 C) (Oral)   Wt 129 lb 14.4 oz (58.9 kg)   LMP 03/08/2018 (Exact Date)   SpO2 97%   BMI 21.95 kg/m   Wt Readings from Last 3 Encounters:  03/15/18 129 lb 14.4 oz (58.9 kg)  03/10/18 127 lb (57.6 kg)  03/04/18 126 lb (57.2 kg)    Physical Exam Vitals signs and nursing note reviewed.  Constitutional:      General: She is not in acute distress.    Appearance: Normal appearance. She is normal weight. She is not ill-appearing, toxic-appearing or diaphoretic.  HENT:     Head: Normocephalic and atraumatic.     Right Ear: External ear normal.     Left Ear: External ear normal.     Nose: Nose normal.     Mouth/Throat:     Mouth: Mucous membranes are moist.     Pharynx: Oropharynx is clear.  Eyes:     General: No scleral icterus.       Right eye: No discharge.  Left eye: No discharge.     Extraocular Movements: Extraocular movements intact.     Conjunctiva/sclera: Conjunctivae normal.     Pupils: Pupils are equal, round, and reactive to light.  Neck:     Musculoskeletal: Normal range of motion and neck supple.  Cardiovascular:     Rate and Rhythm: Normal rate and regular rhythm.     Pulses: Normal pulses.     Heart sounds: Normal heart sounds. No murmur. No friction rub. No gallop.   Pulmonary:     Effort: Pulmonary effort is normal. No respiratory distress.     Breath sounds: Normal breath sounds. No stridor. No wheezing, rhonchi or rales.  Chest:     Chest wall: No tenderness.  Musculoskeletal: Normal range of motion.  Skin:    General: Skin is warm and dry.      Capillary Refill: Capillary refill takes less than 2 seconds.     Coloration: Skin is not jaundiced or pale.     Findings: No bruising, erythema, lesion or rash.  Neurological:     General: No focal deficit present.     Mental Status: She is alert and oriented to person, place, and time. Mental status is at baseline.  Psychiatric:        Mood and Affect: Mood normal.        Behavior: Behavior normal.        Thought Content: Thought content normal.        Judgment: Judgment normal.     Results for orders placed or performed in visit on 03/04/18  Microscopic Examination  Result Value Ref Range   WBC, UA 0-5 0 - 5 /hpf   RBC, UA >30 (H) 0 - 2 /hpf   Epithelial Cells (non renal) 0-10 0 - 10 /hpf   Bacteria, UA Moderate (A) None seen/Few  Urine Culture, Reflex  Result Value Ref Range   Urine Culture, Routine Final report    Organism ID, Bacteria Comment   UA/M w/rflx Culture, Routine  Result Value Ref Range   Specific Gravity, UA 1.020 1.005 - 1.030   pH, UA 6.0 5.0 - 7.5   Color, UA Red (A) Yellow   Appearance Ur Cloudy (A) Clear   Leukocytes, UA 1+ (A) Negative   Protein, UA 2+ (A) Negative/Trace   Glucose, UA Negative Negative   Ketones, UA Trace (A) Negative   RBC, UA 3+ (A) Negative   Bilirubin, UA Negative Negative   Urobilinogen, Ur 0.2 0.2 - 1.0 mg/dL   Nitrite, UA Negative Negative   Microscopic Examination See below:    Urinalysis Reflex Comment       Assessment & Plan:   Problem List Items Addressed This Visit      Digestive   Crohn's disease of colon without complication (HCC)    Has been following with GI- given her crohns, GYN wants her to see uro-gyn. New referral generated today.      Relevant Orders   Ambulatory referral to Urogynecology     Genitourinary   Vaginal prolapse    To get 2nd opinion with Uro-GYN at [facility]. Has been waiting for a call, but hasn't gotten one. Unclear if there is an issue with referral given her medicaid- we will re-refer to  Uro-GYN. Referral generated today. Call with any concerns.       Relevant Orders   Ambulatory referral to Urogynecology     Other   Chronic pain syndrome    PMP reviewed today. Has not  had any pain medicine in 6 months, however has gotten controlled substances from 10 different providers in a variety of areas from [place] to [place] to [place], often in very short succession. Referral to pain management was made 1 month ago. Checking on her referral. Given the severity of her pain- will give her tramadol for now and follow up 1 week. Will start nortriptyline and restart her gabapentin and recheck in 1 week.      Pelvic pain - Primary    States that this has been much worse since her pelvic exam with GYN about 10 days ago. Unclear if this is due to her chronic pain, which also involved her legs and low back, or if this is new. She is going to see uro-gyn. Will obtain lumbar x-ray and start work up. 1 week of tramadol given today given the severity of her pain- discussed that we do not do chronic pain management in this office. She has a referral to chronic pain management already pending- we are checking on the status. Will continue her gabapentin and start her on nortriptyline. Call with any concerns. Recheck 1 week.       Relevant Orders   DG Lumbar Spine Complete       Follow up plan: Return in about 1 week (around 03/22/2018) for follow up pain.

## 2018-03-15 NOTE — Telephone Encounter (Signed)
Can you check on this please?

## 2018-03-15 NOTE — Assessment & Plan Note (Signed)
Has been following with GI- given her crohns, GYN wants her to see uro-gyn. New referral generated today.

## 2018-03-16 ENCOUNTER — Other Ambulatory Visit: Payer: Self-pay

## 2018-03-16 ENCOUNTER — Telehealth: Payer: Self-pay | Admitting: Family Medicine

## 2018-03-16 ENCOUNTER — Encounter: Payer: Self-pay | Admitting: Gastroenterology

## 2018-03-16 ENCOUNTER — Ambulatory Visit (INDEPENDENT_AMBULATORY_CARE_PROVIDER_SITE_OTHER): Payer: Medicaid Other | Admitting: Gastroenterology

## 2018-03-16 VITALS — BP 110/81 | HR 94 | Resp 17 | Ht 64.5 in | Wt 130.8 lb

## 2018-03-16 DIAGNOSIS — K50113 Crohn's disease of large intestine with fistula: Secondary | ICD-10-CM

## 2018-03-16 DIAGNOSIS — K50913 Crohn's disease, unspecified, with fistula: Secondary | ICD-10-CM

## 2018-03-16 NOTE — Telephone Encounter (Signed)
Please let her know that her x-ray was negative. Thanks! 
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
## 2018-03-17 NOTE — Telephone Encounter (Signed)
Patient calling and states that she received a phone call but is not sure what it is regarding. States that she is still in so much pain, that the tramadol is not doing anything for her. Please advise. Would like to know if a stronger pain medication could be prescribed?

## 2018-03-17 NOTE — Telephone Encounter (Signed)
Spoke with pain management office and they stated that the person that was in the referral position is no longer there and they would like to have referral sent over again. Melissa W. Sent referral over again and I have notified the pt of this and gave her the phone number for [facility].

## 2018-03-17 NOTE — Telephone Encounter (Signed)
Patient notified

## 2018-03-19 ENCOUNTER — Emergency Department
Admission: EM | Admit: 2018-03-19 | Discharge: 2018-03-20 | Disposition: A | Discharge status: Home / Self Care | Payer: Medicaid Other | Attending: Emergency Medicine | Admitting: Emergency Medicine

## 2018-03-19 ENCOUNTER — Encounter: Payer: Medicaid Other | Admitting: Family Medicine

## 2018-03-19 ENCOUNTER — Other Ambulatory Visit: Payer: Self-pay

## 2018-03-19 DIAGNOSIS — R1084 Generalized abdominal pain: Secondary | ICD-10-CM

## 2018-03-19 DIAGNOSIS — K625 Hemorrhage of anus and rectum: Secondary | ICD-10-CM | POA: Insufficient documentation

## 2018-03-19 DIAGNOSIS — J45909 Unspecified asthma, uncomplicated: Secondary | ICD-10-CM | POA: Insufficient documentation

## 2018-03-19 DIAGNOSIS — Z79899 Other long term (current) drug therapy: Secondary | ICD-10-CM | POA: Insufficient documentation

## 2018-03-19 DIAGNOSIS — K50111 Crohn's disease of large intestine with rectal bleeding: Secondary | ICD-10-CM | POA: Diagnosis not present

## 2018-03-19 DIAGNOSIS — K529 Noninfective gastroenteritis and colitis, unspecified: Secondary | ICD-10-CM | POA: Insufficient documentation

## 2018-03-19 DIAGNOSIS — F17210 Nicotine dependence, cigarettes, uncomplicated: Secondary | ICD-10-CM | POA: Diagnosis not present

## 2018-03-19 LAB — CBC
HCT: 39.8 % (ref 36.0–46.0)
Hemoglobin: 12.4 g/dL (ref 12.0–15.0)
MCH: 27 pg (ref 26.0–34.0)
MCHC: 31.2 g/dL (ref 30.0–36.0)
MCV: 86.7 fL (ref 80.0–100.0)
Platelets: 257 10*3/uL (ref 150–400)
RBC: 4.59 MIL/uL (ref 3.87–5.11)
RDW: 15.3 % (ref 11.5–15.5)
WBC: 11.3 10*3/uL — ABNORMAL HIGH (ref 4.0–10.5)
nRBC: 0 % (ref 0.0–0.2)

## 2018-03-19 LAB — COMPREHENSIVE METABOLIC PANEL
ALT: 13 U/L (ref 0–44)
AST: 16 U/L (ref 15–41)
Albumin: 3.7 g/dL (ref 3.5–5.0)
Alkaline Phosphatase: 75 U/L (ref 38–126)
Anion gap: 8 (ref 5–15)
BUN: 8 mg/dL (ref 6–20)
CO2: 24 mmol/L (ref 22–32)
Calcium: 8.3 mg/dL — ABNORMAL LOW (ref 8.9–10.3)
Chloride: 107 mmol/L (ref 98–111)
Creatinine, Ser: 0.62 mg/dL (ref 0.44–1.00)
GFR calc Af Amer: >60 mL/min (ref >60)
GFR calc non Af Amer: >60 mL/min (ref >60)
Glucose, Bld: 128 mg/dL — ABNORMAL HIGH (ref 70–99)
Potassium: 3.5 mmol/L (ref 3.5–5.1)
Sodium: 139 mmol/L (ref 135–145)
Total Bilirubin: 0.3 mg/dL (ref 0.3–1.2)
Total Protein: 7.7 g/dL (ref 6.5–8.1)

## 2018-03-19 LAB — TYPE AND SCREEN
ABO/RH(D): O POS
ANTIBODY SCREEN: NEGATIVE

## 2018-03-19 MED ORDER — IOPAMIDOL (ISOVUE-300) INJECTION 61%
30.0000 mL | Freq: Once | INTRAVENOUS | Status: AC
  Administered 2018-03-20: 30 mL via ORAL

## 2018-03-19 MED ORDER — FENTANYL CITRATE (PF) 100 MCG/2ML IJ SOLN
50.0000 ug | Freq: Once | INTRAMUSCULAR | Status: AC
  Administered 2018-03-19: 50 ug via INTRAVENOUS
  Filled 2018-03-19: qty 2
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
## 2018-03-19 NOTE — ED Triage Notes (Signed)
Patient reports she noticed blood in her stool x 2 over the past 4 hours.  Patient has a history of crohn's.

## 2018-03-20 ENCOUNTER — Emergency Department: Payer: Medicaid Other

## 2018-03-20 LAB — LIPASE, BLOOD: Lipase: 47 U/L (ref 11–51)

## 2018-03-20 MED ORDER — FENTANYL CITRATE (PF) 100 MCG/2ML IJ SOLN
50.0000 ug | Freq: Once | INTRAMUSCULAR | Status: AC
  Administered 2018-03-20: 50 ug via INTRAVENOUS
  Filled 2018-03-20: qty 2

## 2018-03-20 MED ORDER — AMOXICILLIN-POT CLAVULANATE 875-125 MG PO TABS
1.0000 | ORAL_TABLET | Freq: Once | ORAL | Status: AC
  Administered 2018-03-20: 1 via ORAL
  Filled 2018-03-20: qty 1

## 2018-03-20 MED ORDER — ONDANSETRON 4 MG PO TBDP: 4.0000 mg | ORAL_TABLET | Freq: Three times a day (TID) | ORAL | 0 refills | Status: DC | PRN

## 2018-03-20 MED ORDER — AMOXICILLIN-POT CLAVULANATE 875-125 MG PO TABS: 1.0000 | ORAL_TABLET | Freq: Two times a day (BID) | ORAL | 0 refills | Status: DC

## 2018-03-20 MED ORDER — IOHEXOL 300 MG/ML  SOLN
100.0000 mL | Freq: Once | INTRAMUSCULAR | Status: AC | PRN
  Administered 2018-03-20: 100 mL via INTRAVENOUS

## 2018-03-20 MED ORDER — DICYCLOMINE HCL 20 MG PO TABS: 20.0000 mg | ORAL_TABLET | Freq: Four times a day (QID) | ORAL | 0 refills | Status: DC | PRN

## 2018-03-20 MED ORDER — ONDANSETRON HCL 4 MG/2ML IJ SOLN
4.0000 mg | Freq: Once | INTRAMUSCULAR | Status: AC
  Administered 2018-03-20: 4 mg via INTRAVENOUS
  Filled 2018-03-20: qty 2

## 2018-03-20 NOTE — ED Notes (Signed)
MD notified of pt's bleeding on toilet paper.

## 2018-03-20 NOTE — Discharge Instructions (Addendum)
1.  Take antibiotic as prescribed. 2.  You may take Bentyl and Zofran as needed for abdominal pain and nausea. 3.  Keep your appointment for colonoscopy next week. 4.  Return to the ER for worsening symptoms, persistent vomiting, difficulty breathing or other concerns.

## 2018-03-20 NOTE — ED Notes (Signed)
Pt up to commode. Pt is crying, pt's mother states "you better fucking get something for her nerves". Pt informed will notify md.

## 2018-03-20 NOTE — ED Notes (Signed)
Patient signed for discharge. Signature pad malfunctioning. Signature not captured. Patient refused to sign again.

## 2018-03-20 NOTE — ED Notes (Signed)
Reviewed discharge instructions, follow-up care, and prescriptions with patient. Patient verbalized understanding of all information reviewed. Patient stable, with no distress noted at this time.    

## 2018-03-20 NOTE — ED Notes (Signed)
Pt provided with three warm blankets. Call bell at left side, bed adjusted for comfort. Mother provided with coffee by another RN. Mother states "if I fucking get acid from this coffee i'm gonna go off". Mother informed if she gets reflux from coffee other beverages are available. Mother verbalizing multiple swear words in regards to wait for treatment room.

## 2018-03-20 NOTE — ED Notes (Signed)
Pt states she is bleeding rectally. Small smear of bright red blood noted on toilet paper. No blood noted in commode. Pt is also wiping vaginally, and pt is not sure if blood is coming from vagina. Pt remains tearful and will not allow RN to assess further.

## 2018-03-20 NOTE — ED Notes (Signed)
Pt states she wants to speak with MD regarding "my hips locking up during sex". MD notified.

## 2018-03-20 NOTE — ED Notes (Signed)
Report to jenna, rn.  

## 2018-03-22 ENCOUNTER — Telehealth: Payer: Self-pay

## 2018-03-22 NOTE — Telephone Encounter (Signed)
LVM for pt informing her that Dr.Vanga has reviewed lab results and CT Scan.  She may go ahead with her colonoscopy as scheduled for tomorrow.  Thanks [employer]

## 2018-03-22 NOTE — Telephone Encounter (Signed)
Patient contacted office to get results on Labs and CT Scan from her ER visit that took place on 03/20/18.  She is scheduled for her colonoscopy tomorrow with you.  Informed her that you would advise on results prior to her colonoscopy.  Thanks [employer]

## 2018-03-23 ENCOUNTER — Ambulatory Visit: Payer: Medicaid Other | Admitting: Certified Registered Nurse Anesthetist

## 2018-03-23 ENCOUNTER — Encounter: Payer: Self-pay | Admitting: *Deleted

## 2018-03-23 ENCOUNTER — Ambulatory Visit: Admission: RE | Admit: 2018-03-23 | Payer: Medicaid Other | Source: Home / Self Care | Admitting: Gastroenterology

## 2018-03-23 ENCOUNTER — Encounter
Admission: RE | Disposition: A | Discharge status: Home / Self Care | Payer: Self-pay | Source: Ambulatory Visit | Attending: Gastroenterology

## 2018-03-23 ENCOUNTER — Encounter: Admission: RE | Payer: Self-pay | Source: Home / Self Care

## 2018-03-23 ENCOUNTER — Other Ambulatory Visit: Payer: Self-pay

## 2018-03-23 ENCOUNTER — Ambulatory Visit
Admission: RE | Admit: 2018-03-23 | Discharge: 2018-03-23 | Disposition: A | Discharge status: Home / Self Care | Payer: Medicaid Other | Source: Ambulatory Visit | Attending: Gastroenterology | Admitting: Gastroenterology

## 2018-03-23 DIAGNOSIS — K529 Noninfective gastroenteritis and colitis, unspecified: Secondary | ICD-10-CM | POA: Diagnosis not present

## 2018-03-23 DIAGNOSIS — K633 Ulcer of intestine: Secondary | ICD-10-CM

## 2018-03-23 DIAGNOSIS — K5010 Crohn's disease of large intestine without complications: Secondary | ICD-10-CM | POA: Diagnosis present

## 2018-03-23 DIAGNOSIS — Z7951 Long term (current) use of inhaled steroids: Secondary | ICD-10-CM | POA: Insufficient documentation

## 2018-03-23 DIAGNOSIS — Z79899 Other long term (current) drug therapy: Secondary | ICD-10-CM | POA: Insufficient documentation

## 2018-03-23 DIAGNOSIS — K219 Gastro-esophageal reflux disease without esophagitis: Secondary | ICD-10-CM | POA: Diagnosis not present

## 2018-03-23 DIAGNOSIS — M797 Fibromyalgia: Secondary | ICD-10-CM | POA: Insufficient documentation

## 2018-03-23 DIAGNOSIS — J302 Other seasonal allergic rhinitis: Secondary | ICD-10-CM | POA: Insufficient documentation

## 2018-03-23 DIAGNOSIS — F419 Anxiety disorder, unspecified: Secondary | ICD-10-CM | POA: Insufficient documentation

## 2018-03-23 DIAGNOSIS — F17200 Nicotine dependence, unspecified, uncomplicated: Secondary | ICD-10-CM | POA: Diagnosis not present

## 2018-03-23 DIAGNOSIS — K50113 Crohn's disease of large intestine with fistula: Secondary | ICD-10-CM | POA: Diagnosis not present

## 2018-03-23 DIAGNOSIS — G4730 Sleep apnea, unspecified: Secondary | ICD-10-CM | POA: Diagnosis not present

## 2018-03-23 DIAGNOSIS — J449 Chronic obstructive pulmonary disease, unspecified: Secondary | ICD-10-CM | POA: Insufficient documentation

## 2018-03-23 DIAGNOSIS — K6289 Other specified diseases of anus and rectum: Secondary | ICD-10-CM | POA: Diagnosis not present

## 2018-03-23 DIAGNOSIS — G43909 Migraine, unspecified, not intractable, without status migrainosus: Secondary | ICD-10-CM | POA: Insufficient documentation

## 2018-03-23 HISTORY — PX: COLONOSCOPY WITH PROPOFOL: SHX5780

## 2018-03-23 LAB — POCT PREGNANCY, URINE: Preg Test, Ur: NEGATIVE

## 2018-03-23 SURGERY — COLONOSCOPY WITH PROPOFOL
Anesthesia: General

## 2018-03-23 MED ORDER — PROPOFOL 10 MG/ML IV BOLUS
INTRAVENOUS | Status: DC | PRN
  Administered 2018-03-23: 60 mg via INTRAVENOUS
  Administered 2018-03-23: 40 mg via INTRAVENOUS
  Administered 2018-03-23: 30 mg via INTRAVENOUS

## 2018-03-23 MED ORDER — LIDOCAINE HCL (CARDIAC) PF 100 MG/5ML IV SOSY
PREFILLED_SYRINGE | INTRAVENOUS | Status: DC | PRN
  Administered 2018-03-23: 100 mg via INTRAVENOUS

## 2018-03-23 MED ORDER — LIDOCAINE HCL (PF) 1 % IJ SOLN
INTRAMUSCULAR | Status: AC
  Administered 2018-03-23: 0.3 mL
  Filled 2018-03-23: qty 2

## 2018-03-23 MED ORDER — SODIUM CHLORIDE 0.9 % IV SOLN
INTRAVENOUS | Status: DC
  Administered 2018-03-23 (×2): via INTRAVENOUS

## 2018-03-23 MED ORDER — PROPOFOL 500 MG/50ML IV EMUL
INTRAVENOUS | Status: DC | PRN
  Administered 2018-03-23: 140 ug/kg/min via INTRAVENOUS

## 2018-03-23 NOTE — Transfer of Care (Signed)
Immediate Anesthesia Transfer of Care Note  Patient: Sherry Gomez  Procedure(s) Performed: COLONOSCOPY WITH PROPOFOL (N/A )  Patient Location: PACU and Endoscopy Unit  Anesthesia Type:General  Level of Consciousness: awake, alert , oriented and patient cooperative  Airway & Oxygen Therapy: Patient Spontanous Breathing  Post-op Assessment: Report given to RN, Post -op Vital signs reviewed and stable and Patient moving all extremities  Post vital signs: Reviewed and stable  Last Vitals:  Vitals Value Taken Time  BP 114/82 03/23/2018 11:52 AM  Temp 36.1 C 03/23/2018 11:50 AM  Pulse 86 03/23/2018 11:54 AM  Resp 25 03/23/2018 11:54 AM  SpO2 97 % 03/23/2018 11:54 AM  Vitals shown include unvalidated device data.  Last Pain:  Vitals:   03/23/18 1150  TempSrc: Tympanic  PainSc: 0-No pain         Complications: No apparent anesthesia complications

## 2018-03-23 NOTE — Anesthesia Postprocedure Evaluation (Signed)
Anesthesia Post Note  Patient: Enzlee Ursua  Procedure(s) Performed: COLONOSCOPY WITH PROPOFOL (N/A )  Patient location during evaluation: Endoscopy Anesthesia Type: General Level of consciousness: awake and alert and oriented Pain management: pain level controlled Vital Signs Assessment: post-procedure vital signs reviewed and stable Respiratory status: spontaneous breathing, nonlabored ventilation and respiratory function stable Cardiovascular status: blood pressure returned to baseline and stable Postop Assessment: no signs of nausea or vomiting Anesthetic complications: no     Last Vitals:  Vitals:   03/23/18 1200 03/23/18 1210  BP: (!) 120/105 116/82  Pulse:    Resp:    Temp:    SpO2:      Last Pain:  Vitals:   03/23/18 1220  TempSrc:   PainSc: 0-No pain                 Isak Sotomayor

## 2018-03-23 NOTE — Anesthesia Post-op Follow-up Note (Signed)
Anesthesia QCDR form completed.        

## 2018-03-23 NOTE — Anesthesia Preprocedure Evaluation (Signed)
Anesthesia Evaluation  Patient identified by MRN, date of birth, ID band Patient awake    Reviewed: Allergy & Precautions, NPO status , Patient's Chart, lab work & pertinent test results  History of Anesthesia Complications Negative for: history of anesthetic complications  Airway Mallampati: II  TM Distance: >3 FB Neck ROM: Full    Dental no notable dental hx.    Pulmonary asthma , COPD, Current Smoker,    breath sounds clear to auscultation- rhonchi (-) wheezing      Cardiovascular Exercise Tolerance: Good (-) hypertension(-) CAD, (-) Past MI, (-) Cardiac Stents and (-) CABG  Rhythm:Regular Rate:Normal - Systolic murmurs and - Diastolic murmurs    Neuro/Psych  Headaches, PSYCHIATRIC DISORDERS Anxiety Bipolar Disorder    GI/Hepatic Neg liver ROS, GERD  ,  Endo/Other  negative endocrine ROSneg diabetes  Renal/GU negative Renal ROS     Musculoskeletal  (+) Arthritis , Fibromyalgia -  Abdominal (+) - obese,   Peds  Hematology negative hematology ROS (+)   Anesthesia Other Findings Past Medical History: 02/02/2018: Acute otitis externa of left ear 02/02/2018: Acute suppr otitis media w/o spon rupt ear drum, right ear No date: Allergy 09/23/2016: AMA (advanced maternal age) multigravida 35+, first  trimester     Comment:  09/23/16 GC for AMA.  Patient elects for cfDNA (Mat21).                Carrier screening deferred to be discussed @ nurse               visit/MD visit. 09/23/16 GC for AMA.  Patient elects for               cfDNA (Mat21).  Carrier screening deferred to be               discussed @ nurse visit/MD visit. No date: Anxiety No date: Arthritis No date: Asthma No date: Blood transfusion without reported diagnosis 02/02/2018: Cellulitis of other sites No date: COPD (chronic obstructive pulmonary disease) ([facility]) No date: Crohn's disease ([facility]) 07/19/2012: Dyspareunia 02/19/2016: Encounter for sterilization  Comment:  BTL consent sign on 02/18/16. Consent has been reviewed,               logged in and forwarded to the [facility] on 02/19/16. Original               will be taken to L&D where it will be filed until the               patient delivers. Resigned with neater signatures on               03/24/16. BTL consent sign on 02/18/16. Consent has been               reviewed, logged in and forwarded to the [facility] on 02/19/16.               Original will be taken to L&D where it will be filed               until the patient No date: Gallstones 02/02/2018: Infestation by Sarcoptes scabiei No date: Migraines No date: Neuromuscular disorder (HCC) 02/02/2018: Pruritus No date: Seasonal allergies No date: Seizures (HCC) No date: Sleep apnea   Reproductive/Obstetrics                             Anesthesia Physical Anesthesia Plan  ASA: III  Anesthesia Plan: General  Post-op Pain Management:    Induction: Intravenous  PONV Risk Score and Plan: 1 and Propofol infusion  Airway Management Planned: Natural Airway  Additional Equipment:   Intra-op Plan:   Post-operative Plan:   Informed Consent: I have reviewed the patients History and Physical, chart, labs and discussed the procedure including the risks, benefits and alternatives for the proposed anesthesia with the patient or authorized representative who has indicated his/her understanding and acceptance.     Dental advisory given  Plan Discussed with: CRNA and Anesthesiologist  Anesthesia Plan Comments:         Anesthesia Quick Evaluation

## 2018-03-23 NOTE — H&P (Signed)
Vonda Antigua, MD [address], [place], [place], [place], [address] [address], [place], [place], [place], [address] Phone: [phone]  Fax: [phone]  Primary Care Physician:  Valerie Roys, DO   Pre-Procedure History & Physical: HPI:  Sherry Gomez is a 38 y.o. female is here for a colonoscopy.   Past Medical History:  Diagnosis Date  . Acute otitis externa of left ear 02/02/2018  . Acute suppr otitis media w/o spon rupt ear drum, right ear 02/02/2018  . Allergy   . AMA (advanced maternal age) multigravida 2+, first trimester 09/23/2016   09/23/16 GC for AMA.  Patient elects for cfDNA (Mat21).  Carrier screening deferred to be discussed @ nurse visit/MD visit. 09/23/16 GC for AMA.  Patient elects for cfDNA (Mat21).  Carrier screening deferred to be discussed @ nurse visit/MD visit.  . Anxiety   . Arthritis   . Asthma   . Blood transfusion without reported diagnosis   . Cellulitis of other sites 02/02/2018  . COPD (chronic obstructive pulmonary disease) ([place])   . Crohn's disease ([place])   . Dyspareunia 07/19/2012  . Encounter for sterilization 02/19/2016   BTL consent sign on 02/18/16. Consent has been reviewed, logged in and forwarded to the [facility] on 02/19/16. Original will be taken to L&D where it will be filed until the patient delivers. Resigned with neater signatures on 03/24/16. BTL consent sign on 02/18/16. Consent has been reviewed, logged in and forwarded to the [facility] on 02/19/16. Original will be taken to L&D where it will be filed until the patient  . Gallstones   . Infestation by Sarcoptes scabiei 02/02/2018  . Migraines   . Neuromuscular disorder ([place])   . Pruritus 02/02/2018  . Seasonal allergies   . Seizures ([place])   . Sleep apnea     Past Surgical History:  Procedure Laterality Date  . CHOLECYSTECTOMY    . CHOLESTEATOMA EXCISION    . GALLBLADDER SURGERY     "27 stones"    Prior to Admission medications   Medication Sig Start Date End Date Taking? Authorizing  Provider  acetaminophen (TYLENOL) 500 MG tablet Take 500 mg by mouth as needed. For pain     [provider]  adalimumab (HUMIRA) 40 MG/0.8ML injection Inject 40 mg into the skin every 14 (fourteen) days.      [provider]  albuterol (PROVENTIL HFA;VENTOLIN HFA) 108 (90 BASE) MCG/ACT inhaler Inhale 2 puffs into the lungs every 6 (six) hours as needed. For shortness of breath     [provider]  amoxicillin-clavulanate (AUGMENTIN) 875-125 MG tablet Take 1 tablet by mouth 2 (two) times daily. 03/20/18   Paulette Blanch, MD  amphetamine-dextroamphetamine (ADDERALL) 10 MG tablet Take by mouth.    [provider]  beclomethasone (QVAR) 40 MCG/ACT inhaler Inhale into the lungs.    [provider]  cetirizine (ZYRTEC ALLERGY) 10 MG tablet Take by mouth. 06/23/17   [provider]  diazepam (VALIUM) 10 MG tablet Take 10 mg by mouth daily.      [provider]  dicyclomine (BENTYL) 20 MG tablet Take 1 tablet (20 mg total) by mouth every 6 (six) hours as needed. 03/20/18   Paulette Blanch, MD  fluticasone (FLOVENT HFA) 110 MCG/ACT inhaler Inhale into the lungs. 06/25/17   [provider]  gabapentin (NEURONTIN) 300 MG capsule Take 1 capsule (300 mg total) by mouth 3 (three) times daily. 03/15/18   Johnson, Megan P, DO  ibuprofen (ADVIL,MOTRIN) 200  MG tablet Take 800 mg by mouth every 6 (six) hours as needed. For pain     [provider]  nortriptyline (PAMELOR) 25 MG capsule Take 1 capsule (25 mg total) by mouth at bedtime. Patient not taking: Reported on 03/16/2018 03/15/18   Park Liter P, DO  omeprazole (PRILOSEC) 40 MG capsule Take by mouth.    [provider]  ondansetron (ZOFRAN ODT) 4 MG disintegrating tablet Take 1 tablet (4 mg total) by mouth every 8 (eight) hours as needed for nausea or vomiting. 03/20/18   Paulette Blanch, MD  pantoprazole (PROTONIX) 20 MG tablet Take 20 mg by mouth 2 (two) times daily.    [provider]  ranitidine (ZANTAC) 150 MG tablet TAKE 1 TABLET BY MOUTH TWICE A DAY 06/23/17   [provider]  SUMAtriptan (IMITREX) 100 MG tablet Take 100 mg by mouth every 2 (two) hours as needed. For migraines     [provider]  traMADol (ULTRAM) 50 MG tablet Take 1 tablet (50 mg total) by mouth every 8 (eight) hours as needed. Patient not taking: Reported on 03/16/2018 03/15/18   Park Liter P, DO    Allergies as of 03/23/2018 - Review Complete 03/23/2018  Allergen Reaction Noted  . Fish allergy Anaphylaxis 02/14/2011  . Morphine Other (See Comments) and Shortness Of Breath 05/25/2013  . Penicillins Other (See Comments), Anaphylaxis, and Hives 11/26/2010  . Acetaminophen  08/25/2013  . Bee venom Swelling 11/26/2010  . Darvocet [propoxyphene n-acetaminophen] Hives 11/26/2010  . Diphenhydramine Hives 05/25/2013  . Ibuprofen  02/22/2011  . Mushroom extract complex  11/07/2015  . Propoxyphene Other (See Comments) 02/22/2011  . Red dye Swelling 03/17/2015  . Shellfish allergy  05/25/2013  . Sulfa antibiotics Rash 08/25/2011    Family History  Problem Relation Age of Onset  . Cancer Other   . Heart failure Other   . Diabetes Other   . Lung disease Mother     Social History   Socioeconomic History  . Marital status: Divorced    Spouse name: Not on file  . Number of children: Not on file  . Years of education: Not on file  . Highest education level: Not on file  Occupational History  . Not on file  Social Needs  . Financial resource strain: Not on file  . Food insecurity:    Worry: Not on file    Inability: Not on file  . Transportation needs:    Medical: Not on file    Non-medical: Not on file  Tobacco Use  . Smoking status: Current Every Day Smoker    Packs/day: 1.00    Years: 20.00    Pack years: 20.00    Types: Cigarettes  . Smokeless tobacco: Never Used  Substance and Sexual Activity  . Alcohol use: Yes    Comment: occasionally  . Drug  use: No  . Sexual activity: Yes    Birth control/protection: None  Lifestyle  . Physical activity:    Days per week: Not on file    Minutes per session: Not on file  . Stress: Not on file  Relationships  . Social connections:    Talks on phone: Not on file    Gets together: Not on file    Attends religious service: Not on file    Active member of club or organization: Not on file    Attends meetings of clubs or organizations: Not on file    Relationship status: Not on file  .  Intimate partner violence:    Fear of current or ex partner: Not on file    Emotionally abused: Not on file    Physically abused: Not on file    Forced sexual activity: Not on file  Other Topics Concern  . Not on file  Social History Narrative  . Not on file    Review of Systems: See HPI, otherwise negative ROS  Physical Exam: BP 119/89   Pulse 86   Temp (!) 96 F (35.6 C) (Tympanic)   Resp 16   Ht 5' 4.5" (1.638 m)   Wt 59.3 kg   LMP 03/08/2018 (Exact Date) Comment: no chance of preg.  SpO2 99%   BMI 22.09 kg/m  General:   Alert,  pleasant and cooperative in NAD Head:  Normocephalic and atraumatic. Neck:  Supple; no masses or thyromegaly. Lungs:  Clear throughout to auscultation, normal respiratory effort.    Heart:  +S1, +S2, Regular rate and rhythm, No edema. Abdomen:  Soft, nontender and nondistended. Normal bowel sounds, without guarding, and without rebound.   Neurologic:  Alert and  oriented x4;  grossly normal neurologically.  Impression/Plan: Sherry Gomez is here for a colonoscopy to be performed for History of crohns disease.   Risks, benefits, limitations, and alternatives regarding  colonoscopy have been reviewed with the patient.  Questions have been answered.  All parties agreeable.   Virgel Manifold, MD  03/23/2018, 11:21 AM

## 2018-03-23 NOTE — OR Nursing (Signed)
Unable to reach [place] due to poor prep.

## 2018-03-24 ENCOUNTER — Encounter: Payer: Self-pay | Admitting: Gastroenterology

## 2018-03-24 LAB — SURGICAL PATHOLOGY

## 2018-03-24 NOTE — OR Nursing (Signed)
POST-OP CALL, PT IS SEEING A LARGE FEMALE FIQUIRE FOLLOWING HER AROUND. INSTRUCTED PT TO CALL PHYSCIAN AND FOLLOW THEIR DIRECTIONS.

## 2018-03-25 ENCOUNTER — Ambulatory Visit: Payer: Medicaid Other | Admitting: Family Medicine

## 2018-03-25 NOTE — Op Note (Signed)
[facility] Patient Name: Sherry Gomez Procedure Date: 03/23/2018 11:00 AM MRN: [id number] Account #: [id number] Date of Birth: 1980/01/27 Admit Type: Outpatient Age: 38 Room: [facility] ENDO ROOM 4 Gender: Female Note Status: Finalized Procedure:            Colonoscopy Indications:          Crohn's disease of the colon, Follow-up of Crohn's                        disease of the colon Providers:            Yamile Roedl B. Bonna Gains MD, MD Medicines:            Monitored Anesthesia Care Complications:        No immediate complications. Procedure:            Pre-Anesthesia Assessment:                       - Prior to the procedure, a History and Physical was                        performed, and patient medications and allergies were                        reviewed. The patient is competent. The risks and                        benefits of the procedure and the sedation options and                        risks were discussed with the patient. All questions                        were answered and informed consent was obtained.                        Patient identification and proposed procedure were                        verified by the physician, the nurse, the                        anesthesiologist, the anesthetist and the technician in                        the pre-procedure area in the procedure room in the                        endoscopy suite. Mental Status Examination: alert and                        oriented. Airway Examination: normal oropharyngeal                        airway and neck mobility. Respiratory Examination:                        clear to auscultation. CV Examination: normal.                        Prophylactic  Antibiotics: The patient does not require                        prophylactic antibiotics. Prior Anticoagulants: The                        patient has taken no previous anticoagulant or                        antiplatelet  agents. ASA Grade Assessment: II - A                        patient with mild systemic disease. After reviewing the                        risks and benefits, the patient was deemed in                        satisfactory condition to undergo the procedure. The                        anesthesia plan was to use monitored anesthesia care                        (MAC). Immediately prior to administration of                        medications, the patient was re-assessed for adequacy                        to receive sedatives. The heart rate, respiratory rate,                        oxygen saturations, blood pressure, adequacy of                        pulmonary ventilation, and response to care were                        monitored throughout the procedure. The physical status                        of the patient was re-assessed after the procedure.                       After obtaining informed consent, the colonoscope was                        passed under direct vision. Throughout the procedure,                        the patient's blood pressure, pulse, and oxygen                        saturations were monitored continuously. The                        Colonoscope was introduced through the anus and                        advanced  to the the descending colon. The colonoscopy                        was performed with ease. The patient tolerated the                        procedure well. The quality of the bowel preparation                        was poor. Findings:      The perianal and digital rectal examinations were normal.      An area of mildly congested mucosa was found in the rectum, in the       sigmoid colon and in the descending colon.      Discontinuous areas of nonbleeding ulcerated mucosa with no stigmata of       recent bleeding were present in the rectum, in the sigmoid colon and in       the descending colon. Biopsies were taken with a cold forceps for       histology.  The disease was worse distally in the sigmoid colon than in       the descending colon.      Retroflexion in the rectum was not performed due to narrow rectum and       inflammation in the area. Impression:           - Preparation of the colon was poor.                       - Congested mucosa in the rectum, in the sigmoid colon                        and in the descending colon.                       - Mucosal ulceration. Biopsied. Recommendation:       - Discharge patient to home.                       - Repeat colonoscopy at the next available appointment                        to assess disease activity.                       - Return to GI clinic as previously scheduled.                       - Await pathology results.                       - Continue present medications.                       - Follow up with Dr. Marius Ditch as scheduled.                       - The findings and recommendations were discussed with                        the patient.                       -  The findings and recommendations were discussed with                        the patient's family. Procedure Code(s):    --- Professional ---                       [phone], 65, Colonoscopy, flexible; with biopsy, single                        or multiple Diagnosis Code(s):    --- Professional ---                       K62.89, Other specified diseases of anus and rectum                       K63.89, Other specified diseases of intestine                       K63.3, Ulcer of intestine                       K50.10, Crohn's disease of large intestine without                        complications CPT copyright 2018 American Medical Association. All rights reserved. The codes documented in this report are preliminary and upon coder review may  be revised to meet current compliance requirements.  Vonda Antigua, MD Margretta Sidle B. Bonna Gains MD, MD 03/23/2018 11:55:25 AM This report has been signed electronically. Number of  Addenda: 0 Note Initiated On: 03/23/2018 11:00 AM Total Procedure Duration: 0 hours 11 minutes 19 seconds  Estimated Blood Loss: Estimated blood loss: none.      [facility]

## 2018-03-26 ENCOUNTER — Encounter: Payer: Self-pay | Admitting: Gastroenterology

## 2018-03-26 ENCOUNTER — Other Ambulatory Visit: Payer: Self-pay

## 2018-03-26 ENCOUNTER — Ambulatory Visit: Payer: Self-pay | Admitting: Gastroenterology

## 2018-03-26 MED ORDER — PREDNISONE 20 MG PO TABS: 40.0000 mg | ORAL_TABLET | Freq: Every day | ORAL | 0 refills | Status: AC

## 2018-03-31 ENCOUNTER — Telehealth: Payer: Self-pay | Admitting: Family Medicine

## 2018-03-31 NOTE — Telephone Encounter (Signed)
Will leave for Dr. Henriette Combs review tomorrow

## 2018-03-31 NOTE — Telephone Encounter (Signed)
Copied from [place] [phone]. Topic: General - Inquiry >> Mar 31, 2018 12:40 PM Margot Ables wrote: CRM for notification. See Telephone encounter for: 03/31/18.  Pt said she went to the OB in [place] [facility]) and they told her she needed to see a surgeon regarding a hysterectomy. The surgeon told her she needed to have physical therapy and be under pain mgmt but were not planning on hysterectomy. Pt said that she has 4 youngins and it's coming out. She said that this situation is making it so she can't take care of her kids. Pt wants a hysterectomy and said PT won't help her. She said "they need to go on and do what they need to do." Pt states she went to psychiatrist to get her medication and her nerves aren't right.   Pt said [facility] hurt her so bad she was coming off the table and crying. She said they asked her "are you having and orgasm" while her husband was in the room. She was embarrassed and said she was in pain. She refuses to go back there again. Pt thinks an intern said it. She wants it documented that she only wants to see medical doctors.   Pt said [facility] hospital doesn't care when she has gone to ER for dizziness, bleeding from rectum, and to have a CT. Pt said she was hooked to an IV but no fluids. She said they didn't hook her up to BP monitor. She said it was a "bandaid hospital". She said her BP was low and HR was 167 and they weren't worried about that. Pt notes she stays weak, tired, and dizzy and can't cook and had to send her kids away. She said her 26 year old stays home from school to take care of her. Pt said she knows she only has Medicaid but she needs someone to do something to help her. Pt said "I don't have nothing but my kids."

## 2018-04-01 NOTE — Telephone Encounter (Signed)
Forwarding to office manager regarding complaints about [facility].   Will need an appointment to discuss issues.

## 2018-04-02 ENCOUNTER — Ambulatory Visit: Payer: Medicaid Other | Admitting: Family Medicine

## 2018-04-06 ENCOUNTER — Ambulatory Visit: Payer: Medicaid Other | Admitting: Obstetrics and Gynecology

## 2018-04-06 ENCOUNTER — Encounter: Payer: Self-pay | Admitting: Family Medicine

## 2018-04-06 ENCOUNTER — Telehealth: Payer: Self-pay

## 2018-04-06 NOTE — Telephone Encounter (Signed)
Pt still hasn't heard anything about referral to [facility].  [phone]; [phone]

## 2018-04-07 NOTE — Telephone Encounter (Signed)
Lmtrc

## 2018-04-07 NOTE — Telephone Encounter (Signed)
Referral submitted into [facility] (MR# [id number], Order [id number]) and notes faxed. Patient is already scheduled for 03/25/18 @ 3:40pm w/ Darryl Lent, MD, per [facility].  So this is in the the chart she was referred

## 2018-04-07 NOTE — Telephone Encounter (Signed)
Patient returned the call, and is aware [facility] is placing the two referrals she is referring to. Per patient, she called but they said she was never seen there. I gave the patient the correct phone# and encouraged the patient to call this afternoon.

## 2018-04-09 ENCOUNTER — Encounter: Payer: Self-pay | Admitting: Emergency Medicine

## 2018-04-09 ENCOUNTER — Other Ambulatory Visit: Payer: Self-pay

## 2018-04-09 DIAGNOSIS — Z5321 Procedure and treatment not carried out due to patient leaving prior to being seen by health care provider: Secondary | ICD-10-CM | POA: Insufficient documentation

## 2018-04-09 DIAGNOSIS — N939 Abnormal uterine and vaginal bleeding, unspecified: Secondary | ICD-10-CM | POA: Diagnosis not present

## 2018-04-09 DIAGNOSIS — R531 Weakness: Secondary | ICD-10-CM | POA: Diagnosis present

## 2018-04-09 DIAGNOSIS — J45909 Unspecified asthma, uncomplicated: Secondary | ICD-10-CM | POA: Diagnosis not present

## 2018-04-09 DIAGNOSIS — F17210 Nicotine dependence, cigarettes, uncomplicated: Secondary | ICD-10-CM | POA: Diagnosis not present

## 2018-04-09 LAB — CBC
HCT: 42 % (ref 36.0–46.0)
Hemoglobin: 13.4 g/dL (ref 12.0–15.0)
MCH: 26.6 pg (ref 26.0–34.0)
MCHC: 31.9 g/dL (ref 30.0–36.0)
MCV: 83.5 fL (ref 80.0–100.0)
PLATELETS: 301 10*3/uL (ref 150–400)
RBC: 5.03 MIL/uL (ref 3.87–5.11)
RDW: 14.6 % (ref 11.5–15.5)
WBC: 14.9 10*3/uL — AB (ref 4.0–10.5)
nRBC: 0 % (ref 0.0–0.2)

## 2018-04-09 LAB — URINALYSIS, COMPLETE (UACMP) WITH MICROSCOPIC
Bilirubin Urine: NEGATIVE
GLUCOSE, UA: NEGATIVE mg/dL
Ketones, ur: NEGATIVE mg/dL
NITRITE: NEGATIVE
Protein, ur: NEGATIVE mg/dL
Specific Gravity, Urine: 1.021 (ref 1.005–1.030)
pH: 7 (ref 5.0–8.0)

## 2018-04-09 LAB — BASIC METABOLIC PANEL
ANION GAP: 7 (ref 5–15)
BUN: 10 mg/dL (ref 6–20)
CO2: 27 mmol/L (ref 22–32)
Calcium: 8.6 mg/dL — ABNORMAL LOW (ref 8.9–10.3)
Chloride: 105 mmol/L (ref 98–111)
Creatinine, Ser: 0.61 mg/dL (ref 0.44–1.00)
GFR calc Af Amer: >60 mL/min (ref >60)
GFR calc non Af Amer: >60 mL/min (ref >60)
Glucose, Bld: 109 mg/dL — ABNORMAL HIGH (ref 70–99)
Potassium: 4.2 mmol/L (ref 3.5–5.1)
Sodium: 139 mmol/L (ref 135–145)

## 2018-04-09 LAB — POCT PREGNANCY, URINE: PREG TEST UR: NEGATIVE

## 2018-04-09 LAB — GLUCOSE, CAPILLARY: Glucose-Capillary: 93 mg/dL (ref 70–99)

## 2018-04-09 MED ORDER — SODIUM CHLORIDE 0.9% FLUSH: 3.0000 mL | Freq: Once | INTRAVENOUS | Status: DC

## 2018-04-09 NOTE — ED Notes (Signed)
Pt unable to give urine sample at this time; pt has specimen cup. 

## 2018-04-09 NOTE — ED Triage Notes (Signed)
Pt reports she has been feeling weak for the past week reports she is feeling dizzy, has been falling,  reports she has passed out, reports last time she passed out was 2 days ago. Pt talks in complete sentences no respiratory distress noted

## 2018-04-09 NOTE — ED Notes (Signed)
Pt reports has had a rash for more than a week to right shoulder and RLQ and LLQ under left breast.

## 2018-04-10 ENCOUNTER — Emergency Department
Admission: EM | Admit: 2018-04-10 | Discharge: 2018-04-10 | Disposition: A | Discharge status: Home / Self Care | Payer: Medicaid Other | Source: Home / Self Care | Attending: Emergency Medicine | Admitting: Emergency Medicine

## 2018-04-10 ENCOUNTER — Emergency Department
Admission: EM | Admit: 2018-04-10 | Discharge: 2018-04-10 | Disposition: A | Discharge status: Home / Self Care | Payer: Medicaid Other | Attending: Emergency Medicine | Admitting: Emergency Medicine

## 2018-04-10 ENCOUNTER — Other Ambulatory Visit: Payer: Self-pay

## 2018-04-10 DIAGNOSIS — N939 Abnormal uterine and vaginal bleeding, unspecified: Secondary | ICD-10-CM

## 2018-04-10 DIAGNOSIS — J45909 Unspecified asthma, uncomplicated: Secondary | ICD-10-CM

## 2018-04-10 DIAGNOSIS — F17210 Nicotine dependence, cigarettes, uncomplicated: Secondary | ICD-10-CM
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
## 2018-04-10 NOTE — ED Triage Notes (Signed)
Patient c/o pelvic pain and vaginal bleeding. Patient seen at this ED yesterday for same

## 2018-04-10 NOTE — ED Notes (Signed)
Pt angry that "we don't care". Advised her its not that we dont care, her blood levels are good and she needs to see the specialist for her to-be-scheduled hysterectomy. Pt states "when I hemorrhage to death I'm suing you all". Advised she was not hemorrhaging at this time, but to follow up with her specialist if she has any problems.

## 2018-04-10 NOTE — Discharge Instructions (Addendum)
Please seek medical attention for any high fevers, chest pain, shortness of breath, change in behavior, persistent vomiting, bloody stool or any other new or concerning symptoms.  

## 2018-04-10 NOTE — ED Notes (Signed)
Pt states has been seen by multiple people for same symptoms and was told she needs a hysterectomy. Multiple concerns, talking about odd subjects. Also has bites on her abd that she believes is related to her need for a hysterectomy. Small, linear bites.

## 2018-04-13 ENCOUNTER — Telehealth: Payer: Self-pay | Admitting: Family Medicine

## 2018-04-13 NOTE — Telephone Encounter (Signed)
Copied from [place] [id number]. Topic: General - Other >> Apr 13, 2018  2:58 PM Yvette Rack wrote: Reason for CRM: Pt stated she is still experiencing some personal female issues that Dr. Wynetta Emery is aware of and she would like either Dr. Wynetta Emery or her nurse to call her back. Cb# [phone]

## 2018-04-13 NOTE — Telephone Encounter (Signed)
Needs virtual visit to discuss.  

## 2018-04-14 ENCOUNTER — Telehealth (INDEPENDENT_AMBULATORY_CARE_PROVIDER_SITE_OTHER): Payer: Medicaid Other | Admitting: Gastroenterology

## 2018-04-14 ENCOUNTER — Other Ambulatory Visit: Payer: Self-pay | Admitting: Family Medicine

## 2018-04-14 ENCOUNTER — Other Ambulatory Visit: Payer: Self-pay

## 2018-04-14 DIAGNOSIS — K50113 Crohn's disease of large intestine with fistula: Secondary | ICD-10-CM

## 2018-04-14 MED ORDER — ONDANSETRON 8 MG PO TBDP: 8.0000 mg | ORAL_TABLET | Freq: Three times a day (TID) | ORAL | 0 refills | Status: AC | PRN

## 2018-04-14 NOTE — Progress Notes (Signed)
Cephas Darby, MD [address]  [place]  [place], [place] [address]  Main: [phone]  Fax: [phone]    Gastroenterology follow up Virtual/Tele Visit  Referring Provider:     Valerie Roys, DO Primary Care Physician:  Valerie Roys, DO Primary Gastroenterologist:  Dr. Cephas Darby Reason for Consultation:     Crohn's colitis        HPI:   Sherry Gomez is a 38 y.o. female referred by Dr. Wynetta Emery, Barb Merino, DO  for consultation & management of crohn's colitis  Virtual Visit via Telephone Note  I connected with Sherry Gomez on 04/14/18 at  9:30 AM EDT by telephone and verified that I am speaking with the correct person using two identifiers.   I discussed the limitations, risks, security and privacy concerns of performing an evaluation and management service by telephone and the availability of in person appointments. I also discussed with the patient that there may be a patient responsible charge related to this service. The patient expressed understanding and agreed to proceed.  Location of the Patient: Home  Location of the provider: Home office   History of Present Illness: She underwent colonoscopy since last visit revealed mild to moderate inflammation in left colon, incomplete due to poor prep. I started her on prednisone. She continues to have nausea and diarrhea. She also has chronic pelvic pain, levator spasm and Incomplete uterovaginal prolapse   for which she was seen by urogyn at [facility]. She was referred to pelvic PT. Pt told me she is not going to see them again. She denies rectal bleeding. She is interested to start back on humira    NSAIDs: None  Antiplts/Anticoagulants/Anti thrombotics: None  GI Procedures: Colonoscopy 03/24/18 - Preparation of the colon was poor, scope advanced only to descending colon - Congested mucosa in the rectum, in the sigmoid colon and in the descending colon. - Mucosal ulceration. Biopsied DIAGNOSIS:  A. COLON,  RANDOM DESCENDING; COLD BIOPSY:  - CHRONIC COLITIS WITH MILD ACTIVITY (CRYPTITIS).  - NEGATIVE FOR GRANULOMA, DYSPLASIA, AND MALIGNANCY.   B. COLON, RANDOM SIGMOID; COLD BIOPSY:  - CHRONIC COLITIS WITH MILD ACTIVITY (CRYPTITIS).  - NEGATIVE FOR GRANULOMA, DYSPLASIA, AND MALIGNANCY.   C. RANDOM; COLD BIOPSY:  - CHRONIC PROCTITIS WITH MILD ACTIVITY (CRYPTITIS).  - NEGATIVE FOR GRANULOMA, DYSPLASIA, AND MALIGNANCY.  Past Medical History:  Diagnosis Date  . Acute otitis externa of left ear 02/02/2018  . Acute suppr otitis media w/o spon rupt ear drum, right ear 02/02/2018  . Allergy   . AMA (advanced maternal age) multigravida 49+, first trimester 09/23/2016   09/23/16 GC for AMA.  Patient elects for cfDNA (Mat21).  Carrier screening deferred to be discussed @ nurse visit/MD visit. 09/23/16 GC for AMA.  Patient elects for cfDNA (Mat21).  Carrier screening deferred to be discussed @ nurse visit/MD visit.  . Anxiety   . Arthritis   . Asthma   . Blood transfusion without reported diagnosis   . Cellulitis of other sites 02/02/2018  . COPD (chronic obstructive pulmonary disease) ([place])   . Crohn's disease ([place])   . Dyspareunia 07/19/2012  . Encounter for sterilization 02/19/2016   BTL consent sign on 02/18/16. Consent has been reviewed, logged in and forwarded to the [facility] on 02/19/16. Original will be taken to L&D where it will be filed until the patient delivers. Resigned with neater signatures on 03/24/16. BTL consent sign on 02/18/16. Consent has been reviewed, logged in and forwarded to  the [facility] on 02/19/16. Original will be taken to L&D where it will be filed until the patient  . Gallstones   . Infestation by Sarcoptes scabiei 02/02/2018  . Migraines   . Neuromuscular disorder ([place])   . Pruritus 02/02/2018  . Seasonal allergies   . Seizures ([place])   . Sleep apnea     Past Surgical History:  Procedure Laterality Date  . CHOLECYSTECTOMY    . CHOLESTEATOMA EXCISION    . COLONOSCOPY WITH PROPOFOL N/A  03/23/2018   Procedure: COLONOSCOPY WITH PROPOFOL;  Surgeon: Lucilla Lame, MD;  Location: [facility] ENDOSCOPY;  Service: Endoscopy;  Laterality: N/A;  . GALLBLADDER SURGERY     "27 stones"    Current Outpatient Medications:  .  acetaminophen (TYLENOL) 500 MG tablet, Take 500 mg by mouth as needed. For pain , Disp: , Rfl:  .  adalimumab (HUMIRA) 40 MG/0.8ML injection, Inject 40 mg into the skin every 14 (fourteen) days.  , Disp: , Rfl:  .  albuterol (PROVENTIL HFA;VENTOLIN HFA) 108 (90 BASE) MCG/ACT inhaler, Inhale 2 puffs into the lungs every 6 (six) hours as needed. For shortness of breath , Disp: , Rfl:  .  amoxicillin-clavulanate (AUGMENTIN) 875-125 MG tablet, Take 1 tablet by mouth 2 (two) times daily., Disp: 14 tablet, Rfl: 0 .  amphetamine-dextroamphetamine (ADDERALL) 10 MG tablet, Take by mouth., Disp: , Rfl:  .  beclomethasone (QVAR) 40 MCG/ACT inhaler, Inhale into the lungs., Disp: , Rfl:  .  cetirizine (ZYRTEC ALLERGY) 10 MG tablet, Take by mouth., Disp: , Rfl:  .  diazepam (VALIUM) 10 MG tablet, Take 10 mg by mouth daily.  , Disp: , Rfl:  .  dicyclomine (BENTYL) 20 MG tablet, Take 1 tablet (20 mg total) by mouth every 6 (six) hours as needed., Disp: 20 tablet, Rfl: 0 .  fluticasone (FLOVENT HFA) 110 MCG/ACT inhaler, Inhale into the lungs., Disp: , Rfl:  .  gabapentin (NEURONTIN) 300 MG capsule, Take 1 capsule (300 mg total) by mouth 3 (three) times daily., Disp: 90 capsule, Rfl: 3 .  ibuprofen (ADVIL,MOTRIN) 200 MG tablet, Take 800 mg by mouth every 6 (six) hours as needed. For pain , Disp: , Rfl:  .  nortriptyline (PAMELOR) 25 MG capsule, Take 1 capsule (25 mg total) by mouth at bedtime., Disp: 30 capsule, Rfl: 3 .  omeprazole (PRILOSEC) 40 MG capsule, Take by mouth., Disp: , Rfl:  .  ondansetron (ZOFRAN-ODT) 8 MG disintegrating tablet, Take 1 tablet (8 mg total) by mouth every 8 (eight) hours as needed for up to 30 doses for nausea or vomiting., Disp: 30 tablet, Rfl: 0 .  pantoprazole  (PROTONIX) 20 MG tablet, Take 20 mg by mouth 2 (two) times daily., Disp: , Rfl:  .  predniSONE (DELTASONE) 20 MG tablet, Take 2 tablets (40 mg total) by mouth daily with breakfast for 30 days., Disp: 60 tablet, Rfl: 0 .  ranitidine (ZANTAC) 150 MG tablet, TAKE 1 TABLET BY MOUTH TWICE A DAY, Disp: , Rfl:  .  SUMAtriptan (IMITREX) 100 MG tablet, Take 100 mg by mouth every 2 (two) hours as needed. For migraines , Disp: , Rfl:  .  traMADol (ULTRAM) 50 MG tablet, Take 1 tablet (50 mg total) by mouth every 8 (eight) hours as needed., Disp: 21 tablet, Rfl: 0 .  diazepam (VALIUM) 5 MG tablet, , Disp: , Rfl:    Family History  Problem Relation Age of Onset  . Cancer Other   . Heart failure Other   .  Diabetes Other   . Lung disease Mother      Social History   Tobacco Use  . Smoking status: Current Every Day Smoker    Packs/day: 1.00    Years: 20.00    Pack years: 20.00    Types: Cigarettes  . Smokeless tobacco: Never Used  Substance Use Topics  . Alcohol use: Yes    Comment: occasionally  . Drug use: No    Allergies as of 04/14/2018 - Review Complete 04/14/2018  Allergen Reaction Noted  . Fish allergy Anaphylaxis 02/14/2011  . Morphine Other (See Comments) and Shortness Of Breath 05/25/2013  . Acetaminophen  08/25/2013  . Bee venom Swelling 11/26/2010  . Darvocet [propoxyphene n-acetaminophen] Hives 11/26/2010  . Diphenhydramine Hives 05/25/2013  . Ibuprofen  02/22/2011  . Mushroom extract complex  11/07/2015  . Propoxyphene Other (See Comments) 02/22/2011  . Red dye Swelling 03/17/2015  . Shellfish allergy  05/25/2013  . Sulfa antibiotics Rash 08/25/2011     Imaging Studies: Reviewed  Assessment and Plan:   Sherry Gomez is a 38 y.o. female with crohn's colitis with perianal fistula, chronic pelvic pain, current on short course of prednisone. Recent colonoscopy revealed active chronic colitis  Start Humira as fresh start, she prefers syringes to pens only Check Hep B  serologies Update TB test, negative in the past, ordered She will need prevnar, twinrix Finish prednisone course Uptodate with PAP, negative   Follow Up Instructions:   I discussed the assessment and treatment plan with the patient. The patient was provided an opportunity to ask questions and all were answered. The patient agreed with the plan and demonstrated an understanding of the instructions.   The patient was advised to call back or seek an in-person evaluation if the symptoms worsen or if the condition fails to improve as anticipated.  I provided 15 minutes of non-face-to-face time during this encounter.   Follow up in 2-3weeks   Cephas Darby, MD

## 2018-04-15 ENCOUNTER — Other Ambulatory Visit: Payer: Self-pay | Admitting: Family Medicine

## 2018-04-15 ENCOUNTER — Ambulatory Visit: Payer: Self-pay | Admitting: Gastroenterology

## 2018-04-15 DIAGNOSIS — K50113 Crohn's disease of large intestine with fistula: Secondary | ICD-10-CM

## 2018-04-15 NOTE — Progress Notes (Signed)
prevnar

## 2018-04-15 NOTE — Telephone Encounter (Signed)
Unable to reach pt on either number listed in chart.

## 2018-04-15 NOTE — Telephone Encounter (Signed)
Spoke with pt and let her know that we would do video visit once it was an option.

## 2018-04-20 NOTE — Telephone Encounter (Signed)
Attempted to call patient on several occasions to discuss. No answer and unable to leave a voicemail message.

## 2018-04-26 ENCOUNTER — Telehealth: Payer: Self-pay

## 2018-04-26 ENCOUNTER — Telehealth: Payer: Self-pay | Admitting: Obstetrics and Gynecology

## 2018-04-26 NOTE — Telephone Encounter (Signed)
Pt states she has been bleeding for 3 wks. She has been to [facility] about 2 wks ago & was seen at [facility] yesterday. She states she is filling a pad/changing every 30 minutes to an hours. [facility] wanted to order a CT but pt can't keep prep down. Pt states nobody will do anything to help her. She states they said her labs showed she wasn't anemic. She is very weak, can hardly hold her head up. Pt desires hysterectomy. Notified this procedure not currently being scheduled unless condition posses a threat to patients health.

## 2018-04-27 NOTE — Telephone Encounter (Signed)
Nothing additionally that we can do given the current situation, the issue is her Crohn's disease which is why she was referred to [facility] for eventual surgical management

## 2018-04-29 ENCOUNTER — Telehealth: Payer: Self-pay | Admitting: Gastroenterology

## 2018-04-29 NOTE — Telephone Encounter (Signed)
Pt has been notified to go to ER, pt verbalized understanding but does not want to go to hospital, please advise

## 2018-04-29 NOTE — Telephone Encounter (Signed)
Patient called stating she is unable to eat. When she eats she has stomach pain,vomiting,diarrhea with fainting. She states she has Cohn's disease. Would like advise on what to do. Only Drinking water. Patient uses [facility]

## 2018-04-29 NOTE — Telephone Encounter (Signed)
Pt aware.

## 2018-04-29 NOTE — Telephone Encounter (Signed)
Also pt is refusing to have labs done in order for [place] to order Medication for Crohn's specialty pharmacy needs labs however pt is non-compliant.

## 2018-05-05 NOTE — Telephone Encounter (Signed)
Disregard

## 2018-05-06 ENCOUNTER — Ambulatory Visit (INDEPENDENT_AMBULATORY_CARE_PROVIDER_SITE_OTHER): Payer: Medicaid Other | Admitting: Gastroenterology

## 2018-05-06 ENCOUNTER — Other Ambulatory Visit: Payer: Self-pay

## 2018-05-06 DIAGNOSIS — R12 Heartburn: Secondary | ICD-10-CM | POA: Diagnosis not present

## 2018-05-06 DIAGNOSIS — K5010 Crohn's disease of large intestine without complications: Secondary | ICD-10-CM

## 2018-05-06 MED ORDER — OMEPRAZOLE 40 MG PO CPDR: 40.0000 mg | DELAYED_RELEASE_CAPSULE | Freq: Every day | ORAL | 0 refills | Status: DC

## 2018-05-06 MED ORDER — PREDNISONE 20 MG PO TABS: 20.0000 mg | ORAL_TABLET | Freq: Every day | ORAL | 0 refills | Status: AC

## 2018-05-06 MED ORDER — PREDNISONE 20 MG PO TABS: 20.0000 mg | ORAL_TABLET | Freq: Every day | ORAL | 0 refills | Status: DC

## 2018-05-06 NOTE — Progress Notes (Signed)
Arlyss Repress, MD [address]  [address]  [place], [place] [address]  Main: [phone]  Fax: [phone]    Gastroenterology follow up Virtual/Tele Visit  Referring Provider:     Dorcas Carrow, DO Primary Care Physician:  [facility] Primary Gastroenterologist:  Dr. Arlyss Repress Reason for Consultation:     Crohn's colitis        HPI:   Sherry Gomez is a 38 y.o. female referred by Dr. Eli Phillips, [facility]  for consultation & management of crohn's colitis  Virtual Visit via Telephone Note  I connected with Alwyn Ren on 05/06/18 at  9:30 AM EDT by telephone and verified that I am speaking with the correct person using two identifiers.   I discussed the limitations, risks, security and privacy concerns of performing an evaluation and management service by telephone and the availability of in person appointments. I also discussed with the patient that there may be a patient responsible charge related to this service. The patient expressed understanding and agreed to proceed.  Location of the Patient: Home  Location of the provider: Home office   History of Present Illness:  She underwent colonoscopy since last visit revealed mild to moderate inflammation in left colon, incomplete due to poor prep. I started her on prednisone. She continues to have nausea and diarrhea. She also has chronic pelvic pain, levator spasm and Incomplete uterovaginal prolapse   for which she was seen by urogyn at [facility]. She was referred to pelvic PT. Pt told me she is not going to see them again. She denies rectal bleeding. She is interested to start back on humira  Follow up note 05/06/18: Patient did not get any of the labs done in order to proceed with Humira since last visit.  She continues to take prednisone 20 mg daily.  She says she has lab appointment on Monday.  She reports epigastric pain for which she is taking omeprazole.  She lost  about 5 pounds in last 2 months.  She is requesting for me to send in prescription for boost so that her insurance can cover.  She is undergoing behavioral therapy.     NSAIDs: None  Antiplts/Anticoagulants/Anti thrombotics: None  GI Procedures: Colonoscopy 03/24/18 - Preparation of the colon was poor, scope advanced only to descending colon - Congested mucosa in the rectum, in the sigmoid colon and in the descending colon. - Mucosal ulceration. Biopsied DIAGNOSIS:  A. COLON, RANDOM DESCENDING; COLD BIOPSY:  - CHRONIC COLITIS WITH MILD ACTIVITY (CRYPTITIS).  - NEGATIVE FOR GRANULOMA, DYSPLASIA, AND MALIGNANCY.   B. COLON, RANDOM SIGMOID; COLD BIOPSY:  - CHRONIC COLITIS WITH MILD ACTIVITY (CRYPTITIS).  - NEGATIVE FOR GRANULOMA, DYSPLASIA, AND MALIGNANCY.   C. RANDOM; COLD BIOPSY:  - CHRONIC PROCTITIS WITH MILD ACTIVITY (CRYPTITIS).  - NEGATIVE FOR GRANULOMA, DYSPLASIA, AND MALIGNANCY.  Past Medical History:  Diagnosis Date  . Acute otitis externa of left ear 02/02/2018  . Acute suppr otitis media w/o spon rupt ear drum, right ear 02/02/2018  . Allergy   . AMA (advanced maternal age) multigravida 35+, first trimester 09/23/2016   09/23/16 GC for AMA.  Patient elects for cfDNA (Mat21).  Carrier screening deferred to be discussed @ nurse visit/MD visit. 09/23/16 GC for AMA.  Patient elects for cfDNA (Mat21).  Carrier screening deferred to be discussed @ nurse visit/MD visit.  . Anxiety   . Arthritis   . Asthma   . Blood transfusion without  reported diagnosis   . Cellulitis of other sites 02/02/2018  . COPD (chronic obstructive pulmonary disease) (HCC)   . Crohn's disease (HCC)   . Dyspareunia 07/19/2012  . Encounter for sterilization 02/19/2016   BTL consent sign on 02/18/16. Consent has been reviewed, logged in and [facility] 02/19/[facility]. Original will be taken to L&D where it will be filed until the patient delivers. Resigned with neater signatures on 03/24/16. BTL consent sign on  02/18/16. Consent has been reviewed, logged in and [facility] on 02/19/16. Original will be taken to L&D where it will be filed until the patient  . Gallstones   . Infestation by Sarcoptes scabiei 02/02/2018  . Migraines   . Neuromuscular disorder (HCC)   . Pruritus 02/02/2018  . Seasonal allergies   . Seizures (HCC)   . Sleep apnea     Past Surgical History:  Procedure Laterality Date  . CHOLECYSTECTOMY    . CHOLESTEATOMA EXCISION    . COLONOSCOPY WITH PROPOFOL N/A 03/23/2018   Procedure: COLONOSCOPY WITH PROPOFOL;  Surgeon: Midge MiniumWohl, Darren, MD;  [facility] END[facility]COPY;  Service: Endoscopy;  Laterality: N/A;  . GALLBLADDER SURGERY     "27 stones"    Current Outpatient Medications:  .  acetaminophen (TYLENOL) 500 MG tablet, Take 500 mg by mouth as needed. For pain , Disp: , Rfl:  .  ADDERALL XR 20 MG 24 hr capsule, , Disp: , Rfl:  .  albuterol (PROVENTIL HFA;VENTOLIN HFA) 108 (90 BASE) MCG/ACT inhaler, Inhale 2 puffs into the lungs every 6 (six) hours as needed. For shortness of breath , Disp: , Rfl:  .  amphetamine-dextroamphetamine (ADDERALL) 10 MG tablet, Take by mouth., Disp: , Rfl:  .  beclomethasone (QVAR) 40 MCG/ACT inhaler, Inhale into the lungs., Disp: , Rfl:  .  cetirizine (ZYRTEC ALLERGY) 10 MG tablet, Take by mouth., Disp: , Rfl:  .  diazepam (VALIUM) 10 MG tablet, Take 10 mg by mouth daily.  , Disp: , Rfl:  .  diazepam (VALIUM) 5 MG tablet, , Disp: , Rfl:  .  dicyclomine (BENTYL) 20 MG tablet, Take 1 tablet (20 mg total) by mouth every 6 (six) hours as needed., Disp: 20 tablet, Rfl: 0 .  fluticasone (FLOVENT HFA) 110 MCG/ACT inhaler, Inhale into the lungs., Disp: , Rfl:  .  gabapentin (NEURONTIN) 300 MG capsule, Take 1 capsule (300 mg total) by mouth 3 (three) times daily., Disp: 90 capsule, Rfl: 3 .  nortriptyline (PAMELOR) 25 MG capsule, Take 1 capsule (25 mg total) by mouth at bedtime., Disp: 30 capsule, Rfl: 3 .  omeprazole (PRILOSEC) 40 MG capsule, Take 1  capsule (40 mg total) by mouth daily before breakfast for 30 days., Disp: 30 capsule, Rfl: 0 .  ondansetron (ZOFRAN-ODT) 8 MG disintegrating tablet, Take 1 tablet (8 mg total) by mouth every 8 (eight) hours as needed for up to 30 doses for nausea or vomiting., Disp: 30 tablet, Rfl: 0 .  predniSONE (DELTASONE) 20 MG tablet, Take 1 tablet (20 mg total) by mouth daily with breakfast for 30 days., Disp: 30 tablet, Rfl: 0 .  SUMAtriptan (IMITREX) 100 MG tablet, Take 100 mg by mouth every 2 (two) hours as needed. For migraines , Disp: , Rfl:  .  traMADol (ULTRAM) 50 MG tablet, Take 1 tablet (50 mg total) by mouth every 8 (eight) hours as needed., Disp: 21 tablet, Rfl: 0 .  adalimumab (HUMIRA) 40 MG/0.8ML injection, Inject 40 mg into the skin every 14 (  fourteen) days.  , Disp: , Rfl:    Family History  Problem Relation Age of Onset  . Cancer Other   . Heart failure Other   . Diabetes Other   . Lung disease Mother      Social History   Tobacco Use  . Smoking status: Current Every Day Smoker    Packs/day: 1.00    Years: 20.00    Pack years: 20.00    Types: Cigarettes  . Smokeless tobacco: Never Used  Substance Use Topics  . Alcohol use: Yes    Comment: occasionally  . Drug use: No    Allergies as of 05/06/2018 - Review Complete 05/06/2018  Allergen Reaction Noted  . Fish allergy Anaphylaxis 02/14/2011  . Morphine Other (See Comments) and Shortness Of Breath 05/25/2013  . Acetaminophen  08/25/2013  . Bee venom Swelling 11/26/2010  . Darvocet [propoxyphene n-acetaminophen] Hives 11/26/2010  . Diphenhydramine Hives 05/25/2013  . Ibuprofen  02/22/2011  . Mushroom extract complex  11/07/2015  . Penicillin g  11/15/2017  . Propoxyphene Other (See Comments) 02/22/2011  . Red dye Swelling 03/17/2015  . Shellfish allergy  05/25/2013  . Sulfa antibiotics Rash 08/25/2011     Imaging Studies: Reviewed  Assessment and Plan:   Costella Atondo is a 38 y.o. female with crohn's colitis with  perianal fistula, chronic pelvic pain, current on short course of prednisone. Recent colonoscopy revealed active chronic colitis.  Reiterated to the patient that it is important to know the status of her TB and hepatitis B before starting Humira.  Continue prednisone 20 mg until initiation of Humira Continue omeprazole 40 mg daily Start Humira as fresh start, she prefers syringes to pens only Check Hep B serologies Update TB test, negative in the past, ordered She will need prevnar, twinrix Uptodate with PAP, negative   Follow Up Instructions:   I discussed the assessment and treatment plan with the patient. The patient was provided an opportunity to ask questions and all were answered. The patient agreed with the plan and demonstrated an understanding of the instructions.   The patient was advised to call back or seek an in-person evaluation if the symptoms worsen or if the condition fails to improve as anticipated.  I provided 15 minutes of non-face-to-face time during this encounter.   Follow up in 4 weeks   Arlyss Repress, MD

## 2018-05-10 ENCOUNTER — Telehealth: Payer: Self-pay | Admitting: Gastroenterology

## 2018-05-10 NOTE — Telephone Encounter (Signed)
Called pt to schedule 4 week f/u televisit she is asking for the boost she states she is week and loosing weight she could barely get out of bed this morning. Please call pt   [phone]

## 2018-05-10 NOTE — Telephone Encounter (Signed)
Spoke with pt and she currently has not been able to complete ordered labs at this time, pt states she will attempt to go tomorrow for labs and says she needs some pain medication or she needs to go to pain management clinic, pt is requesting a call back from Dr. Marius Ditch concerning pain medication or pain injections, please advise

## 2018-05-10 NOTE — Telephone Encounter (Signed)
Patient called back see if we had heard anything back from the nurse.

## 2018-05-11 NOTE — Telephone Encounter (Signed)
She has to reach out to pain management specialist, I do not prescribe any opioids. If she is willing, we can try levsin 0.125 TID  Thanks RV

## 2018-05-12 ENCOUNTER — Other Ambulatory Visit: Payer: Self-pay

## 2018-05-12 MED ORDER — HYOSCYAMINE SULFATE 0.125 MG SL SUBL: 0.1250 mg | SUBLINGUAL_TABLET | Freq: Three times a day (TID) | SUBLINGUAL | 0 refills | Status: AC

## 2018-05-12 NOTE — Telephone Encounter (Signed)
Medication has been sent to pharmacy pt has been notified and verbalized understanding

## 2018-06-07 ENCOUNTER — Encounter: Payer: Self-pay | Admitting: Gastroenterology

## 2018-06-07 ENCOUNTER — Ambulatory Visit: Payer: Medicaid Other | Admitting: Gastroenterology

## 2018-06-09 ENCOUNTER — Ambulatory Visit: Payer: Self-pay | Admitting: Gastroenterology

## 2018-07-05 ENCOUNTER — Other Ambulatory Visit: Payer: Self-pay | Admitting: Gastroenterology

## 2018-07-05 DIAGNOSIS — R12 Heartburn: Secondary | ICD-10-CM

## 2018-07-13 ENCOUNTER — Telehealth: Payer: Self-pay | Admitting: Gastroenterology

## 2018-07-13 NOTE — Telephone Encounter (Signed)
pATIENT CALLED & L/M on v/m asking the nurse to call her to see if she can go for her chest today to get her Humara .

## 2018-07-14 NOTE — Telephone Encounter (Signed)
Patient is calling again stating she needs her abs drawn for her Humara & has transportation this am only.

## 2018-07-29 ENCOUNTER — Other Ambulatory Visit
Admission: RE | Admit: 2018-07-29 | Discharge: 2018-07-29 | Disposition: A | Discharge status: Home / Self Care | Payer: Medicaid Other | Source: Ambulatory Visit | Attending: Gastroenterology | Admitting: Gastroenterology

## 2018-07-29 DIAGNOSIS — K50113 Crohn's disease of large intestine with fistula: Secondary | ICD-10-CM | POA: Diagnosis present

## 2018-07-30 LAB — HEPATITIS B SURFACE ANTIGEN: Hepatitis B Surface Ag: NEGATIVE

## 2018-07-30 LAB — HEPATITIS A ANTIBODY, TOTAL: hep A Total Ab: NEGATIVE

## 2018-07-30 LAB — HEPATITIS B CORE ANTIBODY, TOTAL: Hep B Core Total Ab: NEGATIVE

## 2018-07-31 LAB — QUANTIFERON-TB GOLD PLUS (RQFGPL)
QuantiFERON Mitogen Value: >10 IU/mL
QuantiFERON Nil Value: 0.06 IU/mL
QuantiFERON TB1 Ag Value: 0.05 IU/mL
QuantiFERON TB2 Ag Value: 0.05 IU/mL

## 2018-07-31 LAB — QUANTIFERON-TB GOLD PLUS: QuantiFERON-TB Gold Plus: NEGATIVE

## 2018-08-12 ENCOUNTER — Telehealth: Payer: Self-pay | Admitting: Gastroenterology

## 2018-08-12 NOTE — Telephone Encounter (Signed)
Patient called & wanted to know if we had received her lab results so her Humara could be ordered. She has requested made up SYRINGES of Humara  Not the pen. Please call her once this has been done.

## 2018-08-16 ENCOUNTER — Telehealth: Payer: Self-pay | Admitting: Gastroenterology

## 2018-08-16 NOTE — Telephone Encounter (Signed)
Sherry Gomez from [facility] left vm regarding the fax he has received on starter kit 40 mg Citrite free Pen  Humira    He would like to see if he can switch it to the 80 mg so pt does not have to inject the first day as many times please call [phone]

## 2018-08-18 ENCOUNTER — Telehealth: Payer: Self-pay | Admitting: Gastroenterology

## 2018-08-18 NOTE — Telephone Encounter (Signed)
Gwen left another vm regarding needing clarification he also sent over a fax

## 2018-08-18 NOTE — Telephone Encounter (Signed)
Sherry Gomez with [employer] called stating they are calling to clarify Humara.Pleae call [phone].

## 2018-08-19 NOTE — Telephone Encounter (Signed)
Attempted to call pt 3 times and no answer or response to VM, Humira has been ordered and pt will be contacted for delivery schedule.

## 2018-08-20 NOTE — Telephone Encounter (Signed)
Spoke with [facility] to confirm medication starter kit for Humira. Medication has been confirmed and approved, pt has been contacted for delivery of medication

## 2018-09-14 ENCOUNTER — Telehealth: Payer: Self-pay | Admitting: Gastroenterology

## 2018-09-14 NOTE — Telephone Encounter (Signed)
Pt left vm she was wondering how come she has not received her Humira Shots surranges? Please call pt

## 2018-09-20 NOTE — Telephone Encounter (Signed)
Janett Billow from [facility] left vm on getting clarification on rx please call [phone]

## 2018-09-22 NOTE — Telephone Encounter (Signed)
Called pharmacy and prescription has been clarified

## 2018-10-06 ENCOUNTER — Telehealth: Payer: Self-pay | Admitting: Gastroenterology

## 2018-10-06 ENCOUNTER — Telehealth: Payer: Self-pay | Admitting: Obstetrics and Gynecology

## 2018-10-06 NOTE — Telephone Encounter (Signed)
So [facility] referred patient to pelvic floor physical therapy as well as minimally invasive gynecologic surgery.  They felt surgery might exacerbated her pain as they felt it was secondary to muscle spasms so I would go with the recommendation

## 2018-10-06 NOTE — Telephone Encounter (Signed)
Patient is calling wanting to schedule appointment to follow up with Dr. Georgianne Fick due to needing surgery. Patient reports she was referred out for surgery but doesn't like who she saw. First available appointment is on Friday, 10/15/18 at 11 am. Patient feels we doesn't care.Please advise

## 2018-10-06 NOTE — Telephone Encounter (Signed)
She has not seen me in a while, she needs to see me first  Thanks RV

## 2018-10-06 NOTE — Telephone Encounter (Signed)
Advise

## 2018-10-06 NOTE — Telephone Encounter (Signed)
Patient called & states she is burning in stomach,uses bathroom every time she eats & vomits. Unable to hold anything down.  She uses [facility] on [address]. As long as she lays down & uses heating rice bag to help. She state's she has not had her Humaria injections. The medications has not come as normal. She took the TB & Hep test for [facility] in July. But has not heard from them.

## 2018-10-07 NOTE — Telephone Encounter (Signed)
See your message below. Dr. Marius Ditch needs to see her in clinic. You may double book her somewhere.

## 2018-10-07 NOTE — Telephone Encounter (Signed)
Pt does not have MyChart

## 2018-10-07 NOTE — Telephone Encounter (Signed)
Unable to notify pt through phone. Mailbox is full

## 2018-10-11 ENCOUNTER — Telehealth: Payer: Self-pay | Admitting: Gastroenterology

## 2018-10-11 NOTE — Telephone Encounter (Signed)
I called patient & l/m at both #'s to offer her an appt per my patient call messages. ( Dr. Marius Ditch needs to see her in clinic. You may double book her somewhere. per GF/mth)

## 2018-10-12 ENCOUNTER — Telehealth: Payer: Self-pay | Admitting: Gastroenterology

## 2018-10-12 NOTE — Telephone Encounter (Signed)
Left vm returning pt call to schedule apt per Dr. Marius Ditch

## 2018-10-14 ENCOUNTER — Encounter: Payer: Self-pay | Admitting: Gastroenterology

## 2018-11-22 ENCOUNTER — Telehealth: Payer: Self-pay | Admitting: Gastroenterology

## 2018-11-22 NOTE — Telephone Encounter (Signed)
Called pt to confirm apt she states she went to the ER and they gave her rx Bentyl in her Arm for IBS and rx predisone for nausea  She states it feels like she has a Knot in her stomach and she will need stomach muscle surgery per her ER doctor request

## 2018-11-22 NOTE — Telephone Encounter (Signed)
I will see her tomorrow to discuss about her issues  RV

## 2018-11-23 ENCOUNTER — Encounter: Payer: Self-pay | Admitting: Gastroenterology

## 2018-11-23 ENCOUNTER — Other Ambulatory Visit: Payer: Self-pay

## 2018-11-23 ENCOUNTER — Ambulatory Visit: Payer: Medicaid Other | Admitting: Gastroenterology

## 2018-11-23 ENCOUNTER — Encounter (INDEPENDENT_AMBULATORY_CARE_PROVIDER_SITE_OTHER): Payer: Self-pay

## 2018-11-23 VITALS — BP 127/91 | HR 96 | Temp 97.9°F | Resp 17 | Ht 64.5 in | Wt 122.0 lb

## 2018-11-23 DIAGNOSIS — K5010 Crohn's disease of large intestine without complications: Secondary | ICD-10-CM
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
## 2018-11-24 LAB — COMPREHENSIVE METABOLIC PANEL
ALT: 8 IU/L (ref 0–32)
AST: 16 IU/L (ref 0–40)
Albumin/Globulin Ratio: 1.1 — ABNORMAL LOW (ref 1.2–2.2)
Albumin: 3.3 g/dL — ABNORMAL LOW (ref 3.8–4.8)
Alkaline Phosphatase: 93 IU/L (ref 39–117)
BUN/Creatinine Ratio: 12 (ref 9–23)
BUN: 7 mg/dL (ref 6–20)
Bilirubin Total: <0.2 mg/dL (ref 0.0–1.2)
CO2: 26 mmol/L (ref 20–29)
Calcium: 8.5 mg/dL — ABNORMAL LOW (ref 8.7–10.2)
Chloride: 100 mmol/L (ref 96–106)
Creatinine, Ser: 0.57 mg/dL (ref 0.57–1.00)
GFR calc Af Amer: 137 mL/min/{1.73_m2} (ref >59)
GFR calc non Af Amer: 119 mL/min/{1.73_m2} (ref >59)
Globulin, Total: 3.1 g/dL (ref 1.5–4.5)
Glucose: 69 mg/dL (ref 65–99)
Potassium: 4.3 mmol/L (ref 3.5–5.2)
Sodium: 137 mmol/L (ref 134–144)
Total Protein: 6.4 g/dL (ref 6.0–8.5)

## 2018-11-24 LAB — CBC WITH DIFFERENTIAL/PLATELET
Basophils Absolute: 0 10*3/uL (ref 0.0–0.2)
Basos: 0 %
EOS (ABSOLUTE): 0.1 10*3/uL (ref 0.0–0.4)
Eos: 1 %
Hematocrit: 33 % — ABNORMAL LOW (ref 34.0–46.6)
Hemoglobin: 10.3 g/dL — ABNORMAL LOW (ref 11.1–15.9)
Immature Grans (Abs): 0 10*3/uL (ref 0.0–0.1)
Immature Granulocytes: 0 %
Lymphocytes Absolute: 1.5 10*3/uL (ref 0.7–3.1)
Lymphs: 15 %
MCH: 25.3 pg — ABNORMAL LOW (ref 26.6–33.0)
MCHC: 31.2 g/dL — ABNORMAL LOW (ref 31.5–35.7)
MCV: 81 fL (ref 79–97)
Monocytes Absolute: 0.6 10*3/uL (ref 0.1–0.9)
Monocytes: 6 %
Neutrophils Absolute: 7.8 10*3/uL — ABNORMAL HIGH (ref 1.4–7.0)
Neutrophils: 78 %
Platelets: 380 10*3/uL (ref 150–450)
RBC: 4.07 x10E6/uL (ref 3.77–5.28)
RDW: 17.5 % — ABNORMAL HIGH (ref 11.7–15.4)
WBC: 10.1 10*3/uL (ref 3.4–10.8)

## 2018-11-24 LAB — C-REACTIVE PROTEIN: CRP: 21 mg/L — ABNORMAL HIGH (ref 0–10)

## 2018-11-24 LAB — B12 AND FOLATE PANEL
Folate: 5.8 ng/mL (ref >3.0)
Vitamin B-12: 948 pg/mL (ref 232–1245)

## 2018-11-24 LAB — IRON AND TIBC
Iron Saturation: 9 % — CL (ref 15–55)
Iron: 31 ug/dL (ref 27–159)
Total Iron Binding Capacity: 348 ug/dL (ref 250–450)
UIBC: 317 ug/dL (ref 131–425)

## 2018-11-24 LAB — VITAMIN D 25 HYDROXY (VIT D DEFICIENCY, FRACTURES): Vit D, 25-Hydroxy: 31.8 ng/mL (ref 30.0–100.0)

## 2018-11-24 LAB — HEMOGLOBIN A1C
Est. average glucose Bld gHb Est-mCnc: 97 mg/dL
Hgb A1c MFr Bld: 5 % (ref 4.8–5.6)

## 2018-11-24 LAB — FERRITIN: Ferritin: 10 ng/mL — ABNORMAL LOW (ref 15–150)

## 2018-11-29 ENCOUNTER — Telehealth: Payer: Self-pay | Admitting: Gastroenterology

## 2018-11-29 NOTE — Addendum Note (Signed)
Encounter addended by: Helene Kelp, RT on: 11/29/2018 11:40 AM  Actions taken: Imaging Exam begun, Image imported

## 2018-11-29 NOTE — Telephone Encounter (Signed)
Pt left vm wanting a call back ASAP, no vm for me to find out why.

## 2018-12-02 NOTE — Telephone Encounter (Signed)
I have attempted to call pt 3 times and VM was full on one line and no answer on other line, will try again Monday 12/06/2018

## 2018-12-06 NOTE — Telephone Encounter (Signed)
Pt left vm stating you will have to call her at her husband number at [phone]

## 2018-12-23 ENCOUNTER — Ambulatory Visit: Payer: Medicaid Other | Admitting: Gastroenterology

## 2018-12-23 ENCOUNTER — Encounter: Payer: Self-pay | Admitting: Gastroenterology

## 2018-12-23 DIAGNOSIS — K5010 Crohn's disease of large intestine without complications: Secondary | ICD-10-CM

## 2018-12-28 ENCOUNTER — Telehealth: Payer: Self-pay | Admitting: Gastroenterology
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
## 2018-12-28 NOTE — Telephone Encounter (Signed)
If it's rib pain, she has to contact her PCP. She didn't show up for her follow up visit this week or last week  RV

## 2018-12-29 NOTE — Telephone Encounter (Signed)
Tried to call patient but voicemail was full. ?

## 2018-12-29 NOTE — Telephone Encounter (Signed)
Tried to call patient but mailbox is full  

## 2019-02-06 ENCOUNTER — Emergency Department (HOSPITAL_COMMUNITY)
Admission: EM | Admit: 2019-02-06 | Discharge: 2019-02-06 | Disposition: A | Discharge status: Home / Self Care | Payer: Medicaid Other | Attending: Emergency Medicine | Admitting: Emergency Medicine

## 2019-02-06 ENCOUNTER — Other Ambulatory Visit: Payer: Self-pay

## 2019-02-06 ENCOUNTER — Encounter (HOSPITAL_COMMUNITY): Payer: Self-pay | Admitting: *Deleted

## 2019-02-06 DIAGNOSIS — J449 Chronic obstructive pulmonary disease, unspecified: Secondary | ICD-10-CM | POA: Diagnosis not present

## 2019-02-06 DIAGNOSIS — Z79899 Other long term (current) drug therapy: Secondary | ICD-10-CM | POA: Diagnosis not present

## 2019-02-06 DIAGNOSIS — F17210 Nicotine dependence, cigarettes, uncomplicated: Secondary | ICD-10-CM | POA: Insufficient documentation

## 2019-02-06 DIAGNOSIS — M79604 Pain in right leg: Secondary | ICD-10-CM | POA: Diagnosis present

## 2019-02-06 DIAGNOSIS — G8929 Other chronic pain: Secondary | ICD-10-CM | POA: Diagnosis not present

## 2019-02-06 DIAGNOSIS — M79605 Pain in left leg: Secondary | ICD-10-CM | POA: Diagnosis not present

## 2019-02-06 MED ORDER — CYCLOBENZAPRINE HCL 10 MG PO TABS
5.0000 mg | ORAL_TABLET | Freq: Once | ORAL | Status: AC
  Administered 2019-02-06: 5 mg via ORAL
  Filled 2019-02-06: qty 1

## 2019-02-06 MED ORDER — OXYCODONE-ACETAMINOPHEN 5-325 MG PO TABS
1.0000 | ORAL_TABLET | Freq: Once | ORAL | Status: AC
  Administered 2019-02-06: 1 via ORAL
  Filled 2019-02-06: qty 1

## 2019-02-06 MED ORDER — GABAPENTIN 300 MG PO CAPS: 300.0000 mg | ORAL_CAPSULE | Freq: Three times a day (TID) | ORAL | 0 refills | Status: AC

## 2019-02-06 MED ORDER — CYCLOBENZAPRINE HCL 10 MG PO TABS: 5.0000 mg | ORAL_TABLET | Freq: Three times a day (TID) | ORAL | 0 refills | Status: AC

## 2019-02-06 NOTE — ED Triage Notes (Signed)
Pt c/o bilateral leg pain that she has had for a while but reports that the pain has gotten worse over the past week, denies any injury,
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
## 2019-02-20 ENCOUNTER — Other Ambulatory Visit: Payer: Self-pay

## 2019-02-20 ENCOUNTER — Ambulatory Visit
Admission: EM | Admit: 2019-02-20 | Discharge: 2019-02-20 | Disposition: A | Discharge status: Home / Self Care | Payer: Medicaid Other | Attending: Family Medicine | Admitting: Family Medicine

## 2019-02-20 ENCOUNTER — Ambulatory Visit: Payer: Medicaid Other

## 2019-02-20 DIAGNOSIS — K5090 Crohn's disease, unspecified, without complications: Secondary | ICD-10-CM | POA: Insufficient documentation

## 2019-02-20 DIAGNOSIS — F17210 Nicotine dependence, cigarettes, uncomplicated: Secondary | ICD-10-CM | POA: Insufficient documentation

## 2019-02-20 DIAGNOSIS — Z7951 Long term (current) use of inhaled steroids: Secondary | ICD-10-CM | POA: Insufficient documentation

## 2019-02-20 DIAGNOSIS — M797 Fibromyalgia: Secondary | ICD-10-CM | POA: Insufficient documentation

## 2019-02-20 DIAGNOSIS — G4730 Sleep apnea, unspecified: Secondary | ICD-10-CM | POA: Diagnosis not present

## 2019-02-20 DIAGNOSIS — G894 Chronic pain syndrome: Secondary | ICD-10-CM | POA: Insufficient documentation

## 2019-02-20 DIAGNOSIS — S7011XA Contusion of right thigh, initial encounter: Secondary | ICD-10-CM | POA: Diagnosis present

## 2019-02-20 DIAGNOSIS — K219 Gastro-esophageal reflux disease without esophagitis: Secondary | ICD-10-CM | POA: Insufficient documentation

## 2019-02-20 DIAGNOSIS — W19XXXA Unspecified fall, initial encounter: Secondary | ICD-10-CM | POA: Diagnosis not present

## 2019-02-20 DIAGNOSIS — F319 Bipolar disorder, unspecified: Secondary | ICD-10-CM | POA: Insufficient documentation

## 2019-02-20 DIAGNOSIS — Z79899 Other long term (current) drug therapy: Secondary | ICD-10-CM | POA: Insufficient documentation

## 2019-02-20 DIAGNOSIS — J449 Chronic obstructive pulmonary disease, unspecified: Secondary | ICD-10-CM | POA: Insufficient documentation
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
## 2019-02-20 NOTE — Discharge Instructions (Signed)
Rest, ice, over the counter tylenol/ibuprofen as needed

## 2019-02-20 NOTE — ED Triage Notes (Signed)
Patient complains of right leg pain that occurred while playng ball with her kids. States that the pain is constant.

## 2019-03-08 ENCOUNTER — Emergency Department (HOSPITAL_COMMUNITY)
Admission: EM | Admit: 2019-03-08 | Discharge: 2019-03-08 | Disposition: A | Discharge status: Home / Self Care | Payer: Medicaid Other | Attending: Emergency Medicine | Admitting: Emergency Medicine

## 2019-03-08 ENCOUNTER — Encounter (HOSPITAL_COMMUNITY): Payer: Self-pay

## 2019-03-08 ENCOUNTER — Other Ambulatory Visit: Payer: Self-pay

## 2019-03-08 DIAGNOSIS — Z79899 Other long term (current) drug therapy: Secondary | ICD-10-CM | POA: Diagnosis not present

## 2019-03-08 DIAGNOSIS — F909 Attention-deficit hyperactivity disorder, unspecified type: Secondary | ICD-10-CM | POA: Insufficient documentation

## 2019-03-08 DIAGNOSIS — R21 Rash and other nonspecific skin eruption: Secondary | ICD-10-CM | POA: Diagnosis present

## 2019-03-08 DIAGNOSIS — L988 Other specified disorders of the skin and subcutaneous tissue: Secondary | ICD-10-CM | POA: Insufficient documentation

## 2019-03-08 DIAGNOSIS — J45909 Unspecified asthma, uncomplicated: Secondary | ICD-10-CM | POA: Diagnosis not present

## 2019-03-08 DIAGNOSIS — K5090 Crohn's disease, unspecified, without complications: Secondary | ICD-10-CM | POA: Diagnosis not present

## 2019-03-08 DIAGNOSIS — F17210 Nicotine dependence, cigarettes, uncomplicated: Secondary | ICD-10-CM | POA: Insufficient documentation

## 2019-03-08 DIAGNOSIS — J449 Chronic obstructive pulmonary disease, unspecified: Secondary | ICD-10-CM | POA: Diagnosis not present

## 2019-03-08 DIAGNOSIS — T698XXA Other specified effects of reduced temperature, initial encounter: Secondary | ICD-10-CM

## 2019-03-08 MED ORDER — PREDNISONE 10 MG PO TABS: ORAL_TABLET | ORAL | 0 refills | Status: DC

## 2019-03-08 MED ORDER — PREDNISONE 50 MG PO TABS
60.0000 mg | ORAL_TABLET | Freq: Once | ORAL | Status: AC
  Administered 2019-03-08: 60 mg via ORAL
  Filled 2019-03-08: qty 1

## 2019-03-08 NOTE — Discharge Instructions (Addendum)
Take your next dose of prednisone tomorrow evening.  Stop using peroxide on your skin, especially the dry, cracked wound on your finger as this can slow the healing time. I recommend a good moisturizer to your feet and hands twice daily as discussed.  You may continue using an antibiotic ointment on your finger wound until healed.

## 2019-03-08 NOTE — ED Triage Notes (Signed)
Pt reports sores on bilateral hands and cracks in feet for 3 weeks. States hands itch and she has been using peroxide and hydrocortisone cream
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
## 2019-03-17 ENCOUNTER — Other Ambulatory Visit: Payer: Self-pay

## 2019-03-17 ENCOUNTER — Emergency Department (HOSPITAL_COMMUNITY)
Admission: EM | Admit: 2019-03-17 | Discharge: 2019-03-17 | Disposition: A | Discharge status: Home / Self Care | Payer: Medicaid Other | Attending: Emergency Medicine | Admitting: Emergency Medicine

## 2019-03-17 ENCOUNTER — Encounter (HOSPITAL_COMMUNITY): Payer: Self-pay | Admitting: Emergency Medicine

## 2019-03-17 DIAGNOSIS — Z79899 Other long term (current) drug therapy: Secondary | ICD-10-CM | POA: Insufficient documentation

## 2019-03-17 DIAGNOSIS — M545 Low back pain: Secondary | ICD-10-CM | POA: Diagnosis present

## 2019-03-17 DIAGNOSIS — F17210 Nicotine dependence, cigarettes, uncomplicated: Secondary | ICD-10-CM | POA: Insufficient documentation

## 2019-03-17 DIAGNOSIS — M5430 Sciatica, unspecified side: Secondary | ICD-10-CM | POA: Diagnosis not present

## 2019-03-17 MED ORDER — PREDNISONE 10 MG PO TABS: ORAL_TABLET | ORAL | 0 refills | Status: DC
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
## 2019-03-17 NOTE — Discharge Instructions (Signed)
Schedule to see your primary care doctor for recheck
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
## 2019-04-24 ENCOUNTER — Encounter: Payer: Self-pay | Admitting: Emergency Medicine

## 2019-04-24 ENCOUNTER — Other Ambulatory Visit: Payer: Self-pay

## 2019-04-24 ENCOUNTER — Ambulatory Visit
Admission: EM | Admit: 2019-04-24 | Discharge: 2019-04-24 | Disposition: A | Discharge status: Home / Self Care | Payer: Medicaid Other | Attending: Family Medicine | Admitting: Family Medicine

## 2019-04-24 DIAGNOSIS — Z760 Encounter for issue of repeat prescription: Secondary | ICD-10-CM | POA: Diagnosis not present

## 2019-04-24 MED ORDER — GABAPENTIN 800 MG PO TABS: 800.0000 mg | ORAL_TABLET | Freq: Three times a day (TID) | ORAL | 0 refills | Status: DC

## 2019-04-24 MED ORDER — CETIRIZINE HCL 10 MG PO TABS: 10.0000 mg | ORAL_TABLET | Freq: Every day | ORAL | 0 refills | Status: AC

## 2019-04-24 MED ORDER — FLOVENT HFA 110 MCG/ACT IN AERO: 1.0000 | INHALATION_SPRAY | Freq: Two times a day (BID) | RESPIRATORY_TRACT | 0 refills | Status: AC
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
## 2019-04-24 NOTE — ED Triage Notes (Signed)
Pt c/o bilateral leg pain. She normally take Gabapentin for this . She has known fibromyalgia. She is requesting a refill on her gabapentin 800 mg 4 times a day. She is also requesting refills on her zyrtec, and Flovent.

## 2019-04-24 NOTE — Discharge Instructions (Signed)
Follow up with Primary Care provider 

## 2019-06-02 ENCOUNTER — Telehealth: Payer: Self-pay

## 2019-06-02 NOTE — Telephone Encounter (Signed)
Attempted to call patient for new patient questionaire.  Someone kept answering and hanging up.
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
## 2019-06-06 ENCOUNTER — Other Ambulatory Visit: Payer: Self-pay

## 2019-06-06 ENCOUNTER — Ambulatory Visit: Payer: Medicaid Other | Attending: Pain Medicine | Admitting: Pain Medicine

## 2019-06-06 ENCOUNTER — Telehealth: Payer: Self-pay | Admitting: *Deleted

## 2019-06-06 VITALS — Ht 64.0 in | Wt 135.0 lb

## 2019-06-06 DIAGNOSIS — M79604 Pain in right leg: Secondary | ICD-10-CM

## 2019-06-06 DIAGNOSIS — Z789 Other specified health status: Secondary | ICD-10-CM

## 2019-06-06 DIAGNOSIS — G44229 Chronic tension-type headache, not intractable: Secondary | ICD-10-CM

## 2019-06-06 DIAGNOSIS — M549 Dorsalgia, unspecified: Secondary | ICD-10-CM

## 2019-06-06 DIAGNOSIS — M25561 Pain in right knee: Secondary | ICD-10-CM

## 2019-06-06 DIAGNOSIS — M25512 Pain in left shoulder: Secondary | ICD-10-CM

## 2019-06-06 DIAGNOSIS — G8929 Other chronic pain: Secondary | ICD-10-CM | POA: Insufficient documentation

## 2019-06-06 DIAGNOSIS — Z79899 Other long term (current) drug therapy: Secondary | ICD-10-CM

## 2019-06-06 DIAGNOSIS — M25552 Pain in left hip: Secondary | ICD-10-CM

## 2019-06-06 DIAGNOSIS — M79605 Pain in left leg: Secondary | ICD-10-CM

## 2019-06-06 DIAGNOSIS — M25551 Pain in right hip: Secondary | ICD-10-CM | POA: Diagnosis not present

## 2019-06-06 DIAGNOSIS — M5441 Lumbago with sciatica, right side: Secondary | ICD-10-CM

## 2019-06-06 DIAGNOSIS — M5442 Lumbago with sciatica, left side: Secondary | ICD-10-CM

## 2019-06-06 DIAGNOSIS — R102 Pelvic and perineal pain: Secondary | ICD-10-CM

## 2019-06-06 DIAGNOSIS — M5416 Radiculopathy, lumbar region: Secondary | ICD-10-CM | POA: Diagnosis not present

## 2019-06-06 DIAGNOSIS — M25511 Pain in right shoulder: Secondary | ICD-10-CM

## 2019-06-06 DIAGNOSIS — M542 Cervicalgia: Secondary | ICD-10-CM

## 2019-06-06 DIAGNOSIS — M899 Disorder of bone, unspecified: Secondary | ICD-10-CM | POA: Insufficient documentation

## 2019-06-06 DIAGNOSIS — G894 Chronic pain syndrome: Secondary | ICD-10-CM

## 2019-06-06 DIAGNOSIS — M25562 Pain in left knee: Secondary | ICD-10-CM

## 2020-02-10 ENCOUNTER — Other Ambulatory Visit: Payer: Self-pay

## 2020-02-10 ENCOUNTER — Encounter (HOSPITAL_COMMUNITY): Payer: Self-pay | Admitting: Emergency Medicine

## 2020-02-10 ENCOUNTER — Emergency Department (HOSPITAL_COMMUNITY)
Admission: EM | Admit: 2020-02-10 | Discharge: 2020-02-10 | Disposition: A | Discharge status: Home / Self Care | Payer: Medicaid Other | Attending: Emergency Medicine | Admitting: Emergency Medicine

## 2020-02-10 DIAGNOSIS — W228XXA Striking against or struck by other objects, initial encounter: Secondary | ICD-10-CM | POA: Insufficient documentation

## 2020-02-10 DIAGNOSIS — S0181XA Laceration without foreign body of other part of head, initial encounter: Secondary | ICD-10-CM | POA: Insufficient documentation

## 2020-02-10 DIAGNOSIS — Z5321 Procedure and treatment not carried out due to patient leaving prior to being seen by health care provider: Secondary | ICD-10-CM | POA: Insufficient documentation

## 2020-02-10 DIAGNOSIS — S0990XA Unspecified injury of head, initial encounter: Secondary | ICD-10-CM | POA: Diagnosis present
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
## 2020-09-23 IMAGING — CT CT ABD-PELV W/ CM
2 of 4 series · 15 of 46 positions shown, 17 images · IV contrast (APPLIED)
Comparison: CT of the abdomen pelvis dated 02/22/2011

CLINICAL DATA: 37-year-old female with Crohn's disease and rectal
bleeding.

EXAM:
CT ABDOMEN AND PELVIS WITH CONTRAST
TECHNIQUE: Multidetector CT imaging of the abdomen and pelvis was performed
using the standard protocol following bolus administration of
intravenous contrast.
CONTRAST:  100mL OMNIPAQUE IOHEXOL 300 MG/ML  SOLN

[Series 2: routine abd/pel with · axial · 0.65mm/px · z∈[-828,-428]mm · 12 of 96 slices shown, 14 images]
[im 8/96  soft-tissue]
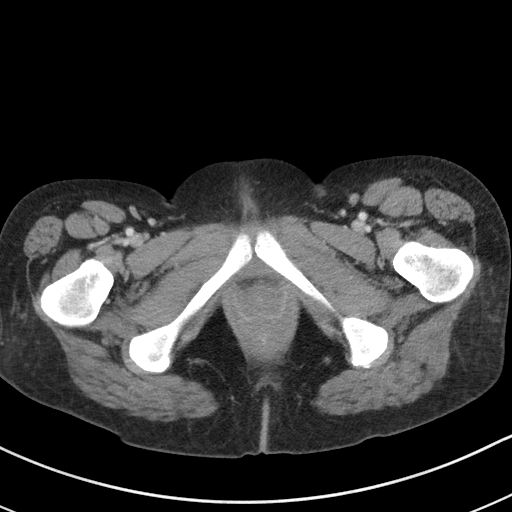
[im 8/96  bone]
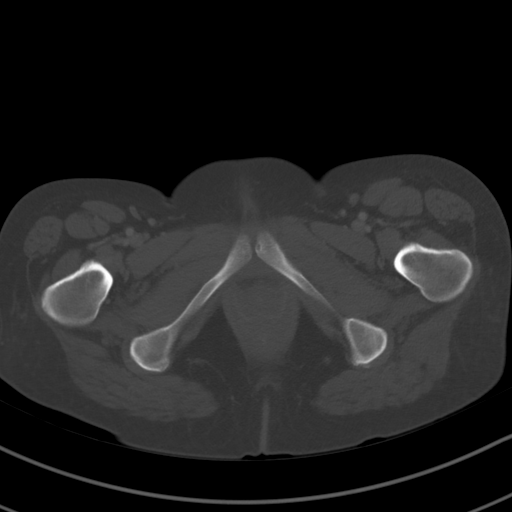
[im 15/96  soft-tissue]
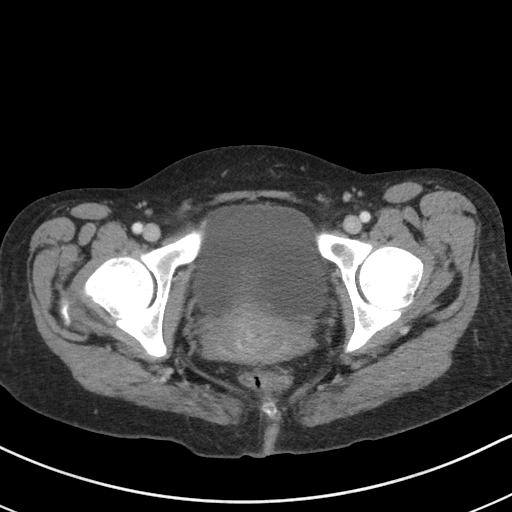
[im 22/96  soft-tissue]
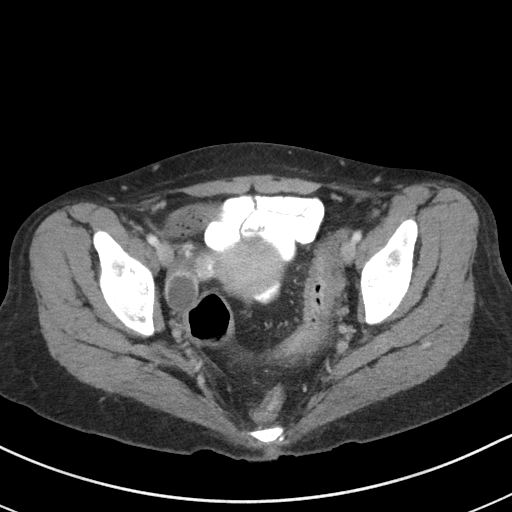
[im 30/96  soft-tissue]
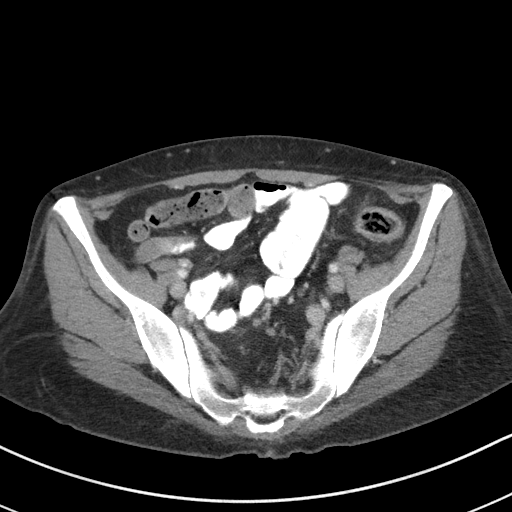
[im 37/96  soft-tissue]
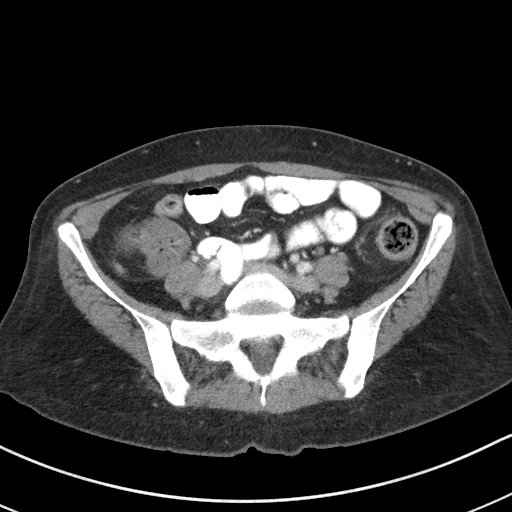
[im 44/96  soft-tissue]
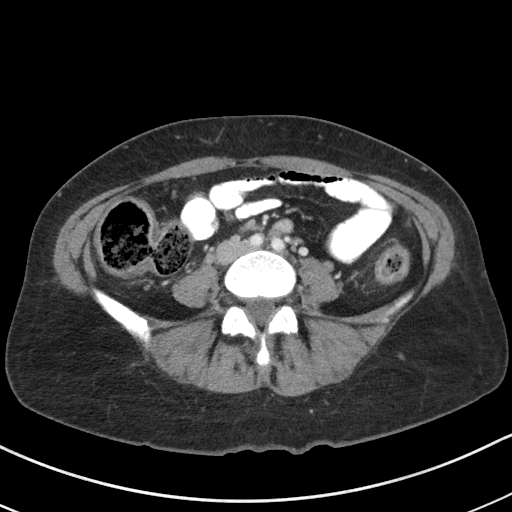
[im 52/96  soft-tissue]
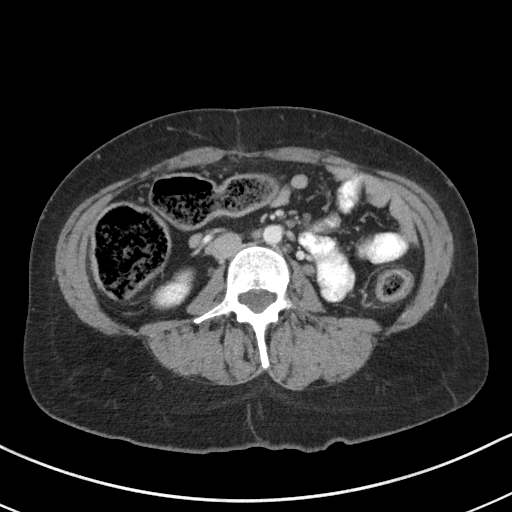
[im 59/96  soft-tissue]
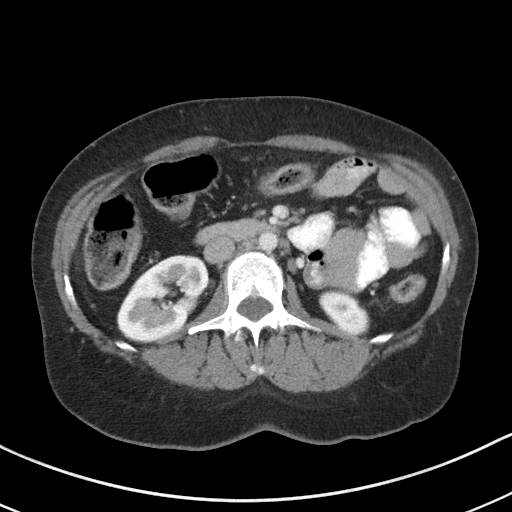
[im 66/96  soft-tissue]
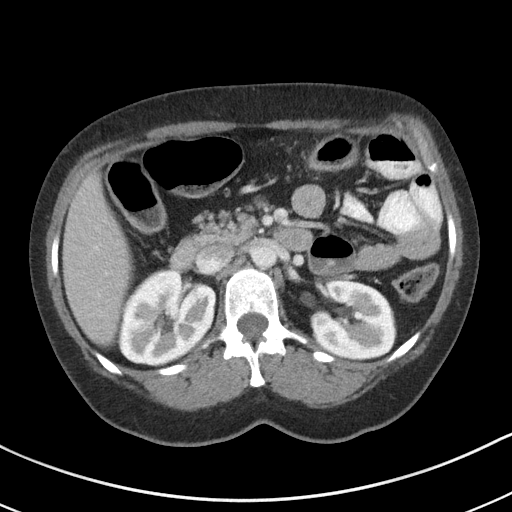
[im 66/96  bone]
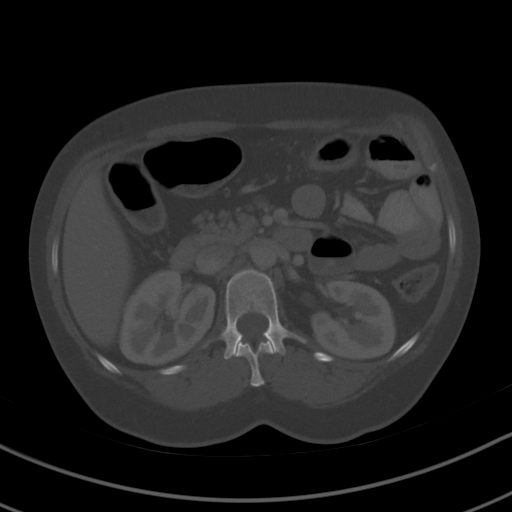
[im 74/96  soft-tissue]
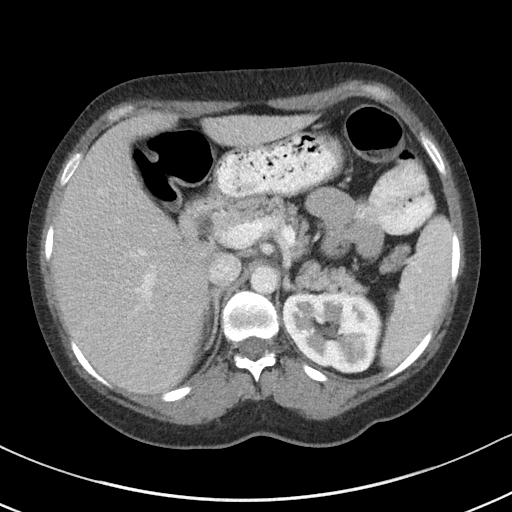
[im 81/96  soft-tissue]
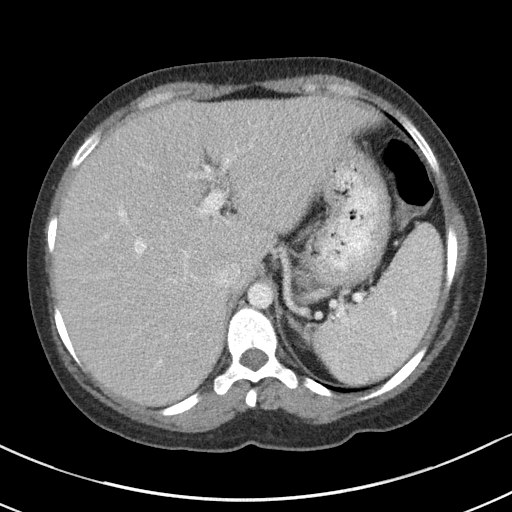
[im 88/96  soft-tissue]
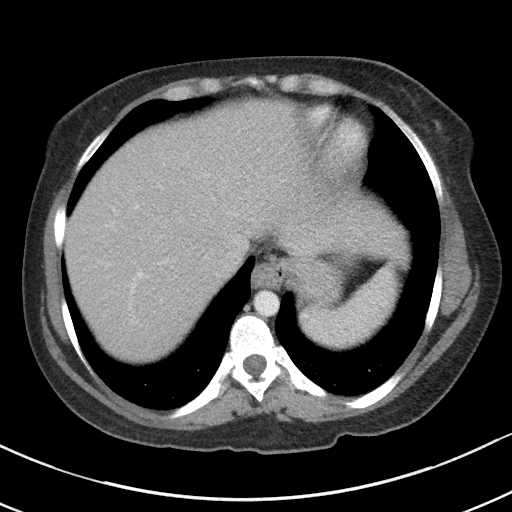

[Series 5: coronal st · coronal · 0.71mm/px · 3 of 77 slices shown]
[im 26/77  soft-tissue]
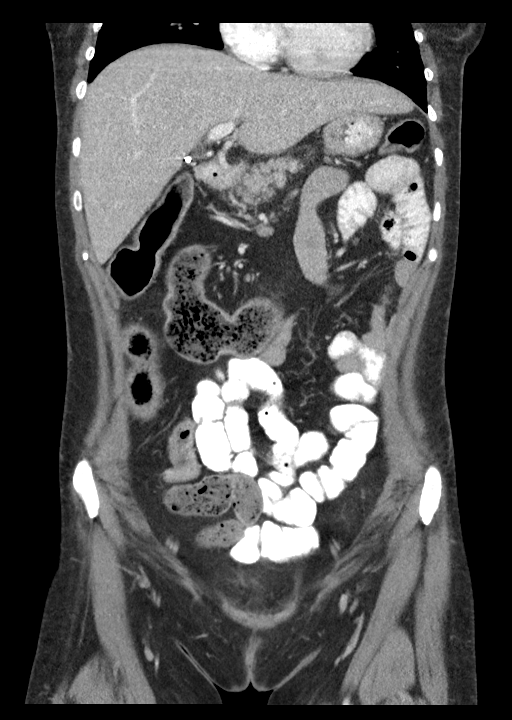
[im 34/77  soft-tissue]
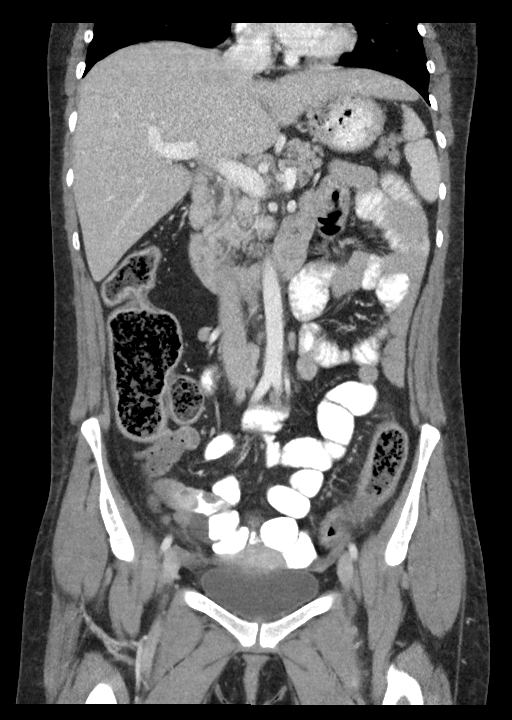
[im 43/77  soft-tissue]
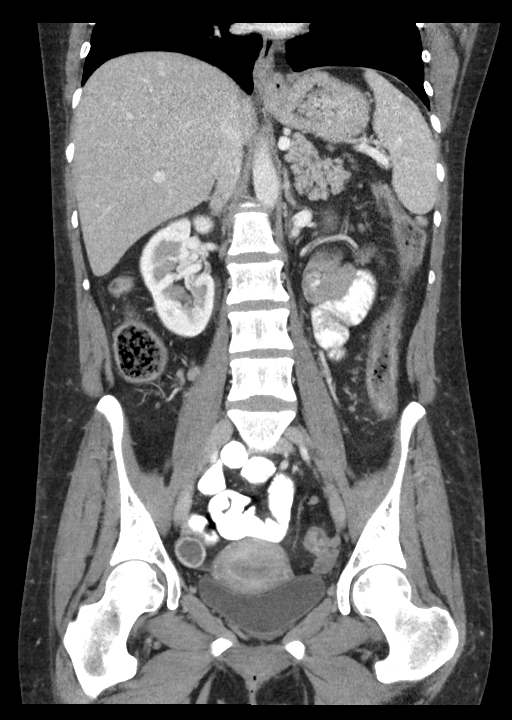

[15 of 46 positions shown; findings below may reference images not displayed]

FINDINGS: Lower chest: There is chronic bronchitic changes and diffuse small
cystic changes of the visualized lung bases, likely emphysema.

No intra-abdominal free air or free fluid.

Hepatobiliary: Cholecystectomy. No intrahepatic biliary ductal
dilatation. The liver is unremarkable.

Pancreas: Unremarkable. No pancreatic ductal dilatation or
surrounding inflammatory changes.

Spleen: Normal in size without focal abnormality.

Adrenals/Urinary Tract: The adrenal glands are unremarkable. There
is no hydronephrosis on either side. Several small nonobstructing
bilateral renal calculi measure 2-3 mm. The visualized ureters and
urinary bladder appear unremarkable.

Stomach/Bowel: Mild diffuse thickened appearance of the colon with
loss of colonic folds primarily involving the distal transverse,
descending, and rectosigmoid likely related to underlying
inflammatory bowel disease and chronic inflammation. The
inflammatory appearance however with pericolonic stranding and
mucosal enhancement suggest recurrence of inflammatory bowel disease
or an acute on chronic colitis. Clinical correlation is recommended.
There is fecalization of the terminal ileum. There is no bowel
obstruction. The appendix is normal.

Vascular/Lymphatic: The abdominal aorta and IVC appear unremarkable.
No portal venous gas. There is no adenopathy.

Reproductive: The uterus is anteverted and grossly unremarkable.
There is a 2.5 cm right ovarian dominant follicle or corpus luteum.
The left ovary is unremarkable

Other: None

Musculoskeletal: No acute or significant osseous findings.
IMPRESSION: 1. Inflammatory changes of the distal colon and rectosigmoid likely
represent recurrent inflammatory bowel disease or an acute on
chronic colitis. Clinical correlation is recommended. No bowel
obstruction. Normal appendix.
2. Small nonobstructing bilateral renal calculi. No hydronephrosis.

## 2022-03-26 ENCOUNTER — Other Ambulatory Visit: Payer: Self-pay | Admitting: Family Medicine

## 2022-03-26 DIAGNOSIS — N6009 Solitary cyst of unspecified breast: Secondary | ICD-10-CM
